# Patient Record
Sex: Female | Born: 1990 | Hispanic: Yes | Marital: Single | Smoking: Former smoker
Health system: Southern US, Community
[De-identification: ages and names within clinical notes are randomized; demographics above are authoritative.]

## PROBLEM LIST (undated history)

## (undated) ENCOUNTER — Emergency Department (HOSPITAL_COMMUNITY): Payer: Self-pay | Source: Home / Self Care

## (undated) DIAGNOSIS — K859 Acute pancreatitis without necrosis or infection, unspecified: Secondary | ICD-10-CM

## (undated) DIAGNOSIS — E079 Disorder of thyroid, unspecified: Secondary | ICD-10-CM

## (undated) DIAGNOSIS — E119 Type 2 diabetes mellitus without complications: Secondary | ICD-10-CM

## (undated) DIAGNOSIS — U071 COVID-19: Secondary | ICD-10-CM

## (undated) HISTORY — PX: THYROIDECTOMY: SHX17

## (undated) HISTORY — PX: HERNIA REPAIR: SHX51

---

## 2017-05-16 ENCOUNTER — Encounter (HOSPITAL_COMMUNITY): Payer: Self-pay | Admitting: Emergency Medicine

## 2017-05-16 DIAGNOSIS — R1010 Upper abdominal pain, unspecified: Secondary | ICD-10-CM | POA: Insufficient documentation

## 2017-05-16 DIAGNOSIS — Z3A01 Less than 8 weeks gestation of pregnancy: Secondary | ICD-10-CM | POA: Diagnosis not present

## 2017-05-16 DIAGNOSIS — O9989 Other specified diseases and conditions complicating pregnancy, childbirth and the puerperium: Secondary | ICD-10-CM | POA: Insufficient documentation

## 2017-05-16 DIAGNOSIS — Z87891 Personal history of nicotine dependence: Secondary | ICD-10-CM | POA: Diagnosis not present

## 2017-05-16 LAB — COMPREHENSIVE METABOLIC PANEL
ALT: 15 U/L (ref 14–54)
ANION GAP: 8 (ref 5–15)
AST: 16 U/L (ref 15–41)
Albumin: 4.2 g/dL (ref 3.5–5.0)
Alkaline Phosphatase: 53 U/L (ref 38–126)
BUN: 14 mg/dL (ref 6–20)
CALCIUM: 9.5 mg/dL (ref 8.9–10.3)
CHLORIDE: 107 mmol/L (ref 101–111)
CO2: 24 mmol/L (ref 22–32)
Creatinine, Ser: 0.75 mg/dL (ref 0.44–1.00)
Glucose, Bld: 115 mg/dL — ABNORMAL HIGH (ref 65–99)
Potassium: 3.6 mmol/L (ref 3.5–5.1)
SODIUM: 139 mmol/L (ref 135–145)
Total Bilirubin: 0.5 mg/dL (ref 0.3–1.2)
Total Protein: 7.2 g/dL (ref 6.5–8.1)

## 2017-05-16 LAB — I-STAT BETA HCG BLOOD, ED (MC, WL, AP ONLY): HCG, QUANTITATIVE: 6.9 m[IU]/mL — AB (ref <5)

## 2017-05-16 LAB — CBC
HCT: 38 % (ref 36.0–46.0)
HEMOGLOBIN: 12.4 g/dL (ref 12.0–15.0)
MCH: 25.1 pg — AB (ref 26.0–34.0)
MCHC: 32.6 g/dL (ref 30.0–36.0)
MCV: 76.9 fL — AB (ref 78.0–100.0)
Platelets: 190 10*3/uL (ref 150–400)
RBC: 4.94 MIL/uL (ref 3.87–5.11)
RDW: 14.6 % (ref 11.5–15.5)
WBC: 11 10*3/uL — AB (ref 4.0–10.5)

## 2017-05-16 LAB — LIPASE, BLOOD: LIPASE: 27 U/L (ref 11–51)

## 2017-05-16 NOTE — ED Notes (Signed)
Pt up to desk waiting for update on times and delays.  Updated and apologized for wait

## 2017-05-16 NOTE — ED Triage Notes (Signed)
Pt presents to ED for assessment of upper abdominal pain intermittent since her last hospitalization for pancreatitis.  Pt states worse x 1 week, with abdominal bloating, diarrhea x 3 days, and intermittent nausea.

## 2017-05-17 ENCOUNTER — Emergency Department (HOSPITAL_COMMUNITY)
Admission: EM | Admit: 2017-05-17 | Discharge: 2017-05-17 | Disposition: A | Discharge status: Home / Self Care | Payer: Medicaid Other | Attending: Emergency Medicine | Admitting: Emergency Medicine

## 2017-05-17 DIAGNOSIS — R1012 Left upper quadrant pain: Secondary | ICD-10-CM

## 2017-05-17 DIAGNOSIS — Z3A01 Less than 8 weeks gestation of pregnancy: Secondary | ICD-10-CM

## 2017-05-17 HISTORY — DX: Disorder of thyroid, unspecified: E07.9

## 2017-05-17 HISTORY — DX: Acute pancreatitis without necrosis or infection, unspecified: K85.90

## 2017-05-17 LAB — HCG, SERUM, QUALITATIVE: PREG SERUM: POSITIVE — AB

## 2017-05-17 LAB — HCG, QUANTITATIVE, PREGNANCY: HCG, BETA CHAIN, QUANT, S: 14 m[IU]/mL — AB (ref <5)

## 2017-05-17 MED ORDER — RANITIDINE HCL 150 MG PO TABS: 150.0000 mg | ORAL_TABLET | Freq: Two times a day (BID) | ORAL | 0 refills | Status: DC

## 2017-05-17 NOTE — ED Provider Notes (Signed)
[facility]-EMERGENCY DEPT Provider Note   CSN: [id number] Arrival date & time: 05/16/17  2034   By signing my name below, I, Margaret Walsh, attest that this documentation has been prepared under the direction and in the presence of Pollina, Canary Brim, * . Electronically Signed: Freida Walsh, Scribe. 05/17/2017. 1:35 AM.   History   Chief Complaint Chief Complaint  Patient presents with  . Abdominal Pain     The history is provided by the patient and a relative. No language interpreter was used.     HPI Comments:  Margaret Walsh is a 26 y.o. female who presents to the Emergency Department complaining of moderate LUQ abdominal pain x 1 week. Pt notes associated nausea and diarrhea. She reports h/o same pain; states she was diagnosed with pancreatitis while she was still living in [place] She states she has pain with or without eating. She denies ETOH use. No alleviating factors noted. Pt has an appointment with new PCP on 06/17/2017. Pt is not a native [place] speaker; history translated by family at bedside.    Past Medical History:  Diagnosis Date  . Pancreatitis   . Thyroid disease     There are no active problems to display for this patient.   Past Surgical History:  Procedure Laterality Date  . THYROIDECTOMY      OB History    No data available       Home Medications    Prior to Admission medications   Medication Sig Start Date End Date Taking? Authorizing Provider  levothyroxine (SYNTHROID, LEVOTHROID) 125 MCG tablet Take 125 mcg by mouth daily before breakfast.   Yes [provider]  ranitidine (ZANTAC) 150 MG tablet Take 1 tablet (150 mg total) by mouth 2 (two) times daily. 05/17/17   Gilda Crease, MD    Family History History reviewed. No pertinent family history.  Social History Social History  Substance Use Topics  . Smoking status: Former Games developer  . Smokeless tobacco: Never Used  . Alcohol use No     Allergies   Patient has no  known allergies.   Review of Systems Review of Systems  Constitutional: Negative for fever.  Gastrointestinal: Positive for abdominal pain, diarrhea and nausea.  All other systems reviewed and are negative.    Physical Exam Updated Vital Signs BP (!) 113/98   Pulse 91   Temp 98.4 F (36.9 C) (Oral)   Resp 18   SpO2 96%   Physical Exam  Constitutional: She is oriented to person, place, and time. She appears well-developed and well-nourished. No distress.  HENT:  Head: Normocephalic and atraumatic.  Right Ear: Hearing normal.  Left Ear: Hearing normal.  Nose: Nose normal.  Mouth/Throat: Oropharynx is clear and moist and mucous membranes are normal.  Eyes: Conjunctivae and EOM are normal. Pupils are equal, round, and reactive to light.  Neck: Normal range of motion. Neck supple.  Cardiovascular: Regular rhythm, S1 normal and S2 normal.  Exam reveals no gallop and no friction rub.   No murmur heard. Pulmonary/Chest: Effort normal and breath sounds normal. No respiratory distress. She exhibits no tenderness.  Abdominal: Soft. Normal appearance and bowel sounds are normal. There is no hepatosplenomegaly. There is tenderness in the left upper quadrant. There is no rebound, no guarding, no tenderness at McBurney's point and negative Murphy's sign. No hernia.  Musculoskeletal: Normal range of motion.  Neurological: She is alert and oriented to person, place, and time. She has normal strength. No cranial nerve deficit  or sensory deficit. Coordination normal. GCS eye subscore is 4. GCS verbal subscore is 5. GCS motor subscore is 6.  Skin: Skin is warm, dry and intact. No rash noted. No cyanosis.  Psychiatric: She has a normal mood and affect. Her speech is normal and behavior is normal. Thought content normal.  Nursing note and vitals reviewed.    ED Treatments / Results  DIAGNOSTIC STUDIES:  Oxygen Saturation is 100% on RA, normal by my interpretation.    COORDINATION OF  CARE:  1:30 AM Discussed treatment plan with pt at bedside and pt agreed to plan.  Labs (all labs ordered are listed, but only abnormal results are displayed) Labs Reviewed  COMPREHENSIVE METABOLIC PANEL - Abnormal; Notable for the following:       Result Value   Glucose, Bld 115 (*)    All other components within normal limits  CBC - Abnormal; Notable for the following:    WBC 11.0 (*)    MCV 76.9 (*)    MCH 25.1 (*)    All other components within normal limits  HCG, SERUM, QUALITATIVE - Abnormal; Notable for the following:    Preg, Serum POSITIVE (*)    All other components within normal limits  HCG, QUANTITATIVE, PREGNANCY - Abnormal; Notable for the following:    hCG, Beta Chain, Quant, S 14 (*)    All other components within normal limits  I-STAT BETA HCG BLOOD, ED ([facility], [facility], [facility] ONLY) - Abnormal; Notable for the following:    I-stat hCG, quantitative 6.9 (*)    All other components within normal limits  LIPASE, BLOOD    EKG  EKG Interpretation None       Radiology No results found.  Procedures Procedures (including critical care time)  Medications Ordered in ED Medications - No data to display   Initial Impression / Assessment and Plan / ED Course  I have reviewed the triage vital signs and the nursing notes.  Pertinent labs & imaging results that were available during my care of the patient were reviewed by me and considered in my medical decision making (see chart for details).     Patient presents to the emergency part for evaluation of abdominal pain. Patient reports that she had a long period of upper abdominal pain associated with nausea, vomiting and diarrhea when she was living in [place] She ultimately ended up hospitalized and told that she had pancreatitis. Cause of pancreatitis was unknown, she did not have a drinking history. No known gallbladder disease. Patient reports that she has started to have symptoms once again. She reports pain in the  upper abdomen, predominantly on the left side. She also has a history of acid reflux disease, no known peptic ulcer disease. No history of hematemesis or melanotic stools.  Patient's blood work unremarkable. She did, however, have a borderline elevated i-STAT beta hCG at 6.9. A formal quantitative beta hCG was performed by the lab and this returned at 14. This is felt to be somewhat equivocal, but patient is actively trying to become pregnant. Have to consider very early pregnancy, patient will be treated with Zantac and follow-up with OB/GYN. There is no lower abdominal pain, pelvic pain or concern for obstetric complication, ectopic pregnancy, etc. She has not had any vaginal bleeding.  Final Clinical Impressions(s) / ED Diagnoses   Final diagnoses:  Left upper quadrant pain  Less than [redacted] weeks gestation of pregnancy    New Prescriptions New Prescriptions   RANITIDINE (ZANTAC) 150 MG  TABLET    Take 1 tablet (150 mg total) by mouth 2 (two) times daily.   I personally performed the services described in this documentation, which was scribed in my presence. The recorded information has been reviewed and is accurate.     Gilda CreasePollina, Christopher J, MD 05/17/17 [phone]

## 2018-04-18 ENCOUNTER — Encounter (HOSPITAL_COMMUNITY): Payer: Self-pay | Admitting: Emergency Medicine

## 2018-04-18 ENCOUNTER — Ambulatory Visit (HOSPITAL_COMMUNITY)
Admission: EM | Admit: 2018-04-18 | Discharge: 2018-04-18 | Disposition: A | Discharge status: Home / Self Care | Payer: Medicaid Other

## 2018-04-18 DIAGNOSIS — K429 Umbilical hernia without obstruction or gangrene: Secondary | ICD-10-CM

## 2018-04-18 NOTE — ED Triage Notes (Signed)
PT reports she has gotten diarrhea the last two times she has gone to a [place] buffet. PT went again Saturday and experienced diarrhea and abdominal pain. PT reports epigastric pain and umbilical pain. PT has known umbilical hernia. Last episode of diarrhea was yesterday. Gave birth 2.5 months ago.   PT is not breastfeeding

## 2018-04-18 NOTE — ED Provider Notes (Signed)
04/18/2018 2:15 PM   DOB: 01-Jul-1991 / MRN: [id number]  SUBJECTIVE:  Margaret Walsh is a 27 y.o. female presenting for diarrhea that occurs with eating [place] food.  Patient had 2 episodes of nonbloody diarrhea stools after eating [place] food.  She tells me the diarrhea has resolved at this time.  She is concerned about a hernia near the umbilicus.  This is been present for months now.  Tells me there is occasional pain about the area.  She denies fever, chills, nausea, rash at this time.  She is moving her bowels normally.  She is urinating normally.  She is allergic to thyroid hormones.   She  has a past medical history of Pancreatitis and Thyroid disease.    She  reports that she has quit smoking. She has never used smokeless tobacco. She reports that she has current or past drug history. Drug: Marijuana. She reports that she does not drink alcohol. She  has no sexual activity history on file. The patient  has a past surgical history that includes Thyroidectomy.  Her family history is not on file.  ROS per HPI  OBJECTIVE:  BP 115/66   Pulse 91   Temp 98.3 F (36.8 C) (Oral)   Resp 16   Wt 150 lb (68 kg)   SpO2 97%   Wt Readings from Last 3 Encounters:  04/18/18 150 lb (68 kg)   Temp Readings from Last 3 Encounters:  04/18/18 98.3 F (36.8 C) (Oral)  05/16/17 98.4 F (36.9 C) (Oral)   BP Readings from Last 3 Encounters:  04/18/18 115/66  05/17/17 (!) 113/98   Pulse Readings from Last 3 Encounters:  04/18/18 91  05/17/17 91    Physical Exam  Constitutional: She is oriented to person, place, and time. She appears well-nourished. No distress.  Eyes: Pupils are equal, round, and reactive to light. EOM are normal.  Cardiovascular: Normal rate.  Pulmonary/Chest: Effort normal.  Abdominal: Bowel sounds are normal. She exhibits no distension and no pulsatile midline mass. There is no hepatosplenomegaly, splenomegaly or hepatomegaly. There is no tenderness. There is no  rigidity, no rebound, no guarding, no CVA tenderness, no tenderness at McBurney's point and negative Murphy's sign. A hernia (Very small umbilical hernia easily reducible.  Negative for tenderness.) is present. Hernia confirmed negative in the right inguinal area and confirmed negative in the left inguinal area.  Neurological: She is alert and oriented to person, place, and time. No cranial nerve deficit. Gait normal.  Skin: Skin is dry. She is not diaphoretic.  Psychiatric: She has a normal mood and affect.  Vitals reviewed.   No results found for this or any previous visit (from the past 72 hour(s)).  No results found.  ASSESSMENT AND PLAN:   Umbilical hernia without obstruction and without gangrene: Exam is reassuring.  [facility]    Discharge Instructions     I have given you the number to a surgeon say that they may give you a second opinion.  Your hernia is not a threat to your health at this time.        The patient is advised to call or return to clinic if she does not see an improvement in symptoms, or to seek the care of the closest emergency department if she worsens with the above plan.   Deliah BostonMichael Shimon Trowbridge, MHS, PA-C 04/18/2018 2:15 PM   Ofilia Neaslark, Edinson Domeier L, PA-C 04/18/18 1416

## 2018-04-18 NOTE — Discharge Instructions (Signed)
I have given you the number to a surgeon say that they may give you a second opinion.  Your hernia is not a threat to your health at this time.

## 2018-05-14 ENCOUNTER — Other Ambulatory Visit: Payer: Self-pay

## 2018-05-14 ENCOUNTER — Encounter (HOSPITAL_COMMUNITY): Payer: Self-pay | Admitting: Emergency Medicine

## 2018-05-14 ENCOUNTER — Emergency Department (HOSPITAL_COMMUNITY)
Admission: EM | Admit: 2018-05-14 | Discharge: 2018-05-15 | Disposition: A | Discharge status: Home / Self Care | Payer: Medicaid Other | Attending: Emergency Medicine | Admitting: Emergency Medicine

## 2018-05-14 DIAGNOSIS — Z87891 Personal history of nicotine dependence: Secondary | ICD-10-CM | POA: Diagnosis not present

## 2018-05-14 DIAGNOSIS — R110 Nausea: Secondary | ICD-10-CM | POA: Insufficient documentation

## 2018-05-14 DIAGNOSIS — R197 Diarrhea, unspecified: Secondary | ICD-10-CM | POA: Insufficient documentation

## 2018-05-14 DIAGNOSIS — E039 Hypothyroidism, unspecified: Secondary | ICD-10-CM | POA: Diagnosis not present

## 2018-05-14 DIAGNOSIS — R1012 Left upper quadrant pain: Secondary | ICD-10-CM | POA: Diagnosis present

## 2018-05-14 DIAGNOSIS — K429 Umbilical hernia without obstruction or gangrene: Secondary | ICD-10-CM | POA: Diagnosis not present

## 2018-05-14 DIAGNOSIS — Z79899 Other long term (current) drug therapy: Secondary | ICD-10-CM | POA: Diagnosis not present

## 2018-05-14 LAB — COMPREHENSIVE METABOLIC PANEL
ALK PHOS: 54 U/L (ref 38–126)
ALT: 18 U/L (ref 0–44)
AST: 14 U/L — AB (ref 15–41)
Albumin: 3.9 g/dL (ref 3.5–5.0)
Anion gap: 9 (ref 5–15)
BUN: 20 mg/dL (ref 6–20)
CALCIUM: 9.3 mg/dL (ref 8.9–10.3)
CHLORIDE: 106 mmol/L (ref 98–111)
CO2: 25 mmol/L (ref 22–32)
CREATININE: 1.16 mg/dL — AB (ref 0.44–1.00)
GFR calc Af Amer: >60 mL/min (ref >60)
Glucose, Bld: 92 mg/dL (ref 70–99)
Potassium: 4.2 mmol/L (ref 3.5–5.1)
SODIUM: 140 mmol/L (ref 135–145)
Total Bilirubin: 0.7 mg/dL (ref 0.3–1.2)
Total Protein: 7.3 g/dL (ref 6.5–8.1)

## 2018-05-14 LAB — LIPASE, BLOOD: Lipase: 36 U/L (ref 11–51)

## 2018-05-14 LAB — URINALYSIS, ROUTINE W REFLEX MICROSCOPIC
Bilirubin Urine: NEGATIVE
Glucose, UA: NEGATIVE mg/dL
Hgb urine dipstick: NEGATIVE
Ketones, ur: NEGATIVE mg/dL
Nitrite: NEGATIVE
PH: 7 (ref 5.0–8.0)
Protein, ur: NEGATIVE mg/dL
SPECIFIC GRAVITY, URINE: 1.02 (ref 1.005–1.030)

## 2018-05-14 LAB — CBC
HCT: 39.4 % (ref 36.0–46.0)
Hemoglobin: 12.2 g/dL (ref 12.0–15.0)
MCH: 25.4 pg — AB (ref 26.0–34.0)
MCHC: 31 g/dL (ref 30.0–36.0)
MCV: 81.9 fL (ref 78.0–100.0)
PLATELETS: 198 10*3/uL (ref 150–400)
RBC: 4.81 MIL/uL (ref 3.87–5.11)
RDW: 14.1 % (ref 11.5–15.5)
WBC: 9.2 10*3/uL (ref 4.0–10.5)

## 2018-05-14 LAB — I-STAT BETA HCG BLOOD, ED (MC, WL, AP ONLY): I-stat hCG, quantitative: <5 m[IU]/mL (ref <5)

## 2018-05-14 NOTE — ED Triage Notes (Signed)
Pt c/o LUQ pain radiating around mid abdomen onset yesterday, +n/v/d, pt has known hernia and is 3 mo PP.

## 2018-05-15 ENCOUNTER — Encounter (HOSPITAL_COMMUNITY): Payer: Self-pay | Admitting: Radiology

## 2018-05-15 ENCOUNTER — Emergency Department (HOSPITAL_COMMUNITY): Payer: Medicaid Other

## 2018-05-15 LAB — I-STAT CG4 LACTIC ACID, ED: Lactic Acid, Venous: 0.65 mmol/L (ref 0.5–1.9)

## 2018-05-15 MED ORDER — NAPROXEN 500 MG PO TABS: 500.0000 mg | ORAL_TABLET | Freq: Two times a day (BID) | ORAL | 0 refills | Status: DC

## 2018-05-15 MED ORDER — KETOROLAC TROMETHAMINE 30 MG/ML IJ SOLN
30.0000 mg | Freq: Once | INTRAMUSCULAR | Status: AC
  Administered 2018-05-15: 30 mg via INTRAVENOUS
  Filled 2018-05-15: qty 1

## 2018-05-15 MED ORDER — HYDROMORPHONE HCL 1 MG/ML IJ SOLN
1.0000 mg | Freq: Once | INTRAMUSCULAR | Status: AC
  Administered 2018-05-15: 1 mg via INTRAVENOUS
  Filled 2018-05-15: qty 1

## 2018-05-15 MED ORDER — ONDANSETRON HCL 4 MG/2ML IJ SOLN
4.0000 mg | Freq: Once | INTRAMUSCULAR | Status: AC
  Administered 2018-05-15: 4 mg via INTRAVENOUS
  Filled 2018-05-15: qty 2

## 2018-05-15 MED ORDER — ONDANSETRON 4 MG PO TBDP: 4.0000 mg | ORAL_TABLET | Freq: Three times a day (TID) | ORAL | 0 refills | Status: DC | PRN

## 2018-05-15 MED ORDER — IOHEXOL 300 MG/ML  SOLN
100.0000 mL | Freq: Once | INTRAMUSCULAR | Status: AC
  Administered 2018-05-15: 100 mL via INTRAVENOUS

## 2018-05-15 MED ORDER — OXYCODONE-ACETAMINOPHEN 5-325 MG PO TABS: 1.0000 | ORAL_TABLET | Freq: Four times a day (QID) | ORAL | 0 refills | Status: DC | PRN

## 2018-05-15 MED ORDER — SODIUM CHLORIDE 0.9 % IV BOLUS
500.0000 mL | Freq: Once | INTRAVENOUS | Status: AC
  Administered 2018-05-15: 500 mL via INTRAVENOUS

## 2018-05-15 NOTE — ED Provider Notes (Signed)
[facility] EMERGENCY DEPARTMENT Provider Note   CSN: [id number] Arrival date & time: 05/14/18  2214     History   Chief Complaint Chief Complaint  Patient presents with  . Abdominal Pain    HPI Margaret Walsh is a 27 y.o. female.  27 year old female with a history of pancreatitis and thyroid nodule presents to the emergency department for abdominal pain.  Pain is in her left upper quadrant and is nonradiating.  She has had associated nausea and reports a few episodes of diarrhea yesterday.  When asked to point to the location of pain, patient indicates pain primarily at her umbilicus.  This is the site of a known umbilical hernia.  She is 3 months postpartum and states that she followed up with general surgery.  They had decided not to surgically repair her hernia as there is plans for her to have future children.  She has no history of abdominal surgeries.  No fevers, vomiting, melena, hematochezia, dysuria, hematuria, vaginal bleeding or discharge.  Took ibuprofen for discomfort prior to arrival without relief.  She is not breastfeeding.  The history is provided by the patient. No language interpreter was used.  Abdominal Pain      Past Medical History:  Diagnosis Date  . Pancreatitis   . Thyroid disease     There are no active problems to display for this patient.   Past Surgical History:  Procedure Laterality Date  . THYROIDECTOMY       OB History   None      Home Medications    Prior to Admission medications   Medication Sig Start Date End Date Taking? Authorizing Provider  levothyroxine (SYNTHROID, LEVOTHROID) 125 MCG tablet Take 125 mcg by mouth daily before breakfast.    [provider]  naproxen (NAPROSYN) 500 MG tablet Take 1 tablet (500 mg total) by mouth 2 (two) times daily. 05/15/18   Antony Madura, PA-C  oxyCODONE-acetaminophen (PERCOCET/ROXICET) 5-325 MG tablet Take 1-2 tablets by mouth every 6 (six) hours as needed for severe  pain. 05/15/18   Antony Madura, PA-C  ranitidine (ZANTAC) 150 MG tablet Take 1 tablet (150 mg total) by mouth 2 (two) times daily. 05/17/17   Gilda Crease, MD    Family History No family history on file.  Social History Social History   Tobacco Use  . Smoking status: Former Games developer  . Smokeless tobacco: Never Used  Substance Use Topics  . Alcohol use: No  . Drug use: Yes    Types: Marijuana     Allergies   Thyroid hormones   Review of Systems Review of Systems  Gastrointestinal: Positive for abdominal pain.  Ten systems reviewed and are negative for acute change, except as noted in the HPI.    Physical Exam Updated Vital Signs BP 111/81   Pulse 81   Temp (!) 97.5 F (36.4 C) (Oral)   Resp 16   Ht 5\' 3"  (1.6 m)   Wt 79.8 kg (176 lb)   LMP 04/27/2018   SpO2 100%   BMI 31.18 kg/m   Physical Exam  Constitutional: She is oriented to person, place, and time. She appears well-developed and well-nourished. No distress.  Appears uncomfortable.  HENT:  Head: Normocephalic and atraumatic.  Eyes: Conjunctivae and EOM are normal. No scleral icterus.  Neck: Normal range of motion.  Cardiovascular: Normal rate, regular rhythm and intact distal pulses.  Pulmonary/Chest: Effort normal. No stridor. No respiratory distress. She has no wheezes.  Respirations even  and unlabored  Abdominal:  Palpable umbilical hernia.  Tender to palpation.  Unable to reduce secondary to discomfort.  Abdomen is otherwise soft.  Palpation in lower quadrants refers to the left upper quadrant.  No peritoneal signs.  Musculoskeletal: Normal range of motion.  Neurological: She is alert and oriented to person, place, and time. She exhibits normal muscle tone. Coordination normal.  Skin: Skin is warm and dry. No rash noted. She is not diaphoretic. No erythema. No pallor.  Psychiatric: She has a normal mood and affect. Her behavior is normal.  Nursing note and vitals reviewed.    ED Treatments /  Results  Labs (all labs ordered are listed, but only abnormal results are displayed) Labs Reviewed  COMPREHENSIVE METABOLIC PANEL - Abnormal; Notable for the following components:      Result Value   Creatinine, Ser 1.16 (*)    AST 14 (*)    All other components within normal limits  CBC - Abnormal; Notable for the following components:   MCH 25.4 (*)    All other components within normal limits  URINALYSIS, ROUTINE W REFLEX MICROSCOPIC - Abnormal; Notable for the following components:   APPearance HAZY (*)    Leukocytes, UA MODERATE (*)    Bacteria, UA RARE (*)    All other components within normal limits  LIPASE, BLOOD  I-STAT BETA HCG BLOOD, ED ([facility], [facility], [facility] ONLY)  I-STAT CG4 LACTIC ACID, ED    EKG None  Radiology Ct Abdomen Pelvis W Contrast  Result Date: 05/15/2018 CLINICAL DATA:  Abdominal pain around a hernia. EXAM: CT ABDOMEN AND PELVIS WITH CONTRAST TECHNIQUE: Multidetector CT imaging of the abdomen and pelvis was performed using the standard protocol following bolus administration of intravenous contrast. CONTRAST:  100mL OMNIPAQUE IOHEXOL 300 MG/ML  SOLN COMPARISON:  None. FINDINGS: Lower chest:  No contributory findings. Hepatobiliary: Tiny low-density in the right liver that is too small for densitometry/characterization.No evidence of biliary obstruction or stone. Pancreas: Unremarkable. Spleen: Unremarkable. Adrenals/Urinary Tract: Negative adrenals. No hydronephrosis or stone. Unremarkable bladder. Stomach/Bowel:  No obstruction. No appendicitis. Vascular/Lymphatic: No acute vascular abnormality. No mass or adenopathy. Reproductive:No pathologic findings. Other: Small fatty periumbilical hernia. Mild reticulation of herniated fat. More prominent reticulation of neighboring subcutaneous fat. No collection. Musculoskeletal: No acute abnormalities. IMPRESSION: Small fatty umbilical hernia. The herniated fat is mildly reticulated but there is prominent edema of the neighboring  subcutaneous fat. This could reflect cellulitis or reaction to hernia incarceration. Electronically Signed   By: Marnee SpringJonathon  Watts M.D.   On: 05/15/2018 07:15    Procedures Procedures (including critical care time)  Medications Ordered in ED Medications  ketorolac (TORADOL) 30 MG/ML injection 30 mg (has no administration in time range)  ondansetron (ZOFRAN) injection 4 mg (has no administration in time range)  HYDROmorphone (DILAUDID) injection 1 mg (1 mg Intravenous Given 05/15/18 0603)  sodium chloride 0.9 % bolus 500 mL (500 mLs Intravenous New Bag/Given 05/15/18 0602)  iohexol (OMNIPAQUE) 300 MG/ML solution 100 mL (100 mLs Intravenous Contrast Given 05/15/18 0639)     Initial Impression / Assessment and Plan / ED Course  I have reviewed the triage vital signs and the nursing notes.  Pertinent labs & imaging results that were available during my care of the patient were reviewed by me and considered in my medical decision making (see chart for details).     27 year old female presents for abdominal pain.  She has a palpable umbilical hernia.  This was unable to be reduced following pain  medication.  A CT scan was performed which shows herniation of abdominal fat.  Question early strangulation of the fat.  No other fluid collection.  No bowel involvement.  Laboratory work-up today has been reassuring.  Pain controlled with Dilaudid and Toradol.  Have advised follow-up with general surgery.  Will give NSAIDs and pain medication for home use.  Despite being postpartum, patient is not breast-feeding.  She has been instructed to return for new or concerning symptoms.   Final Clinical Impressions(s) / ED Diagnoses   Final diagnoses:  Umbilical hernia without obstruction and without gangrene    ED Discharge Orders        Ordered    naproxen (NAPROSYN) 500 MG tablet  2 times daily     05/15/18 0737    oxyCODONE-acetaminophen (PERCOCET/ROXICET) 5-325 MG tablet  Every 6 hours PRN     05/15/18  0737       Antony Madura, PA-C 05/15/18 0739    Gilda Crease, MD 05/15/18 [phone]

## 2018-05-15 NOTE — ED Notes (Signed)
Patient transported to CT 

## 2018-05-15 NOTE — ED Notes (Signed)
ED Provider at bedside. 

## 2018-05-15 NOTE — Discharge Instructions (Signed)
Recomendamos realizar un seguimiento con [place] general para una evaluacin adicional de los sntomas.  Tome naproxeno segn lo prescrito en el intermedio para el control del dolor.  Si el dolor sigue siendo intenso, puede usar Percocet segn lo prescrito.  No conduzca ni beba alcohol despus de tomar [employer] Corporationeste medicamento, ya que puede causarle somnolencia y Audiological scientistafectar su juicio.  Usted puede regresar al departamento de emergencias para sntomas nuevos o preocupantes.  We recommend follow-up with general surgery for further evaluation of your symptoms.  Take naproxen as prescribed in the interim for pain control.  Should your pain remains severe, you may use Percocet as prescribed.  Do not drive or drink alcohol after taking this medication as it may make you drowsy and impair your judgment.  You may return to the emergency department for new or concerning symptoms.

## 2018-06-26 ENCOUNTER — Encounter (HOSPITAL_COMMUNITY): Payer: Self-pay | Admitting: Emergency Medicine

## 2018-06-26 ENCOUNTER — Other Ambulatory Visit: Payer: Self-pay

## 2018-06-26 ENCOUNTER — Emergency Department (HOSPITAL_COMMUNITY)
Admission: EM | Admit: 2018-06-26 | Discharge: 2018-06-27 | Disposition: A | Discharge status: Home / Self Care | Payer: Medicaid Other | Attending: Emergency Medicine | Admitting: Emergency Medicine

## 2018-06-26 ENCOUNTER — Emergency Department (HOSPITAL_COMMUNITY): Payer: Medicaid Other

## 2018-06-26 DIAGNOSIS — Z79899 Other long term (current) drug therapy: Secondary | ICD-10-CM | POA: Diagnosis not present

## 2018-06-26 DIAGNOSIS — Z87891 Personal history of nicotine dependence: Secondary | ICD-10-CM | POA: Insufficient documentation

## 2018-06-26 DIAGNOSIS — Z23 Encounter for immunization: Secondary | ICD-10-CM | POA: Insufficient documentation

## 2018-06-26 DIAGNOSIS — S61011A Laceration without foreign body of right thumb without damage to nail, initial encounter: Secondary | ICD-10-CM | POA: Diagnosis present

## 2018-06-26 DIAGNOSIS — Y92000 Kitchen of unspecified non-institutional (private) residence as  the place of occurrence of the external cause: Secondary | ICD-10-CM | POA: Insufficient documentation

## 2018-06-26 DIAGNOSIS — Y999 Unspecified external cause status: Secondary | ICD-10-CM | POA: Insufficient documentation

## 2018-06-26 DIAGNOSIS — W260XXA Contact with knife, initial encounter: Secondary | ICD-10-CM | POA: Insufficient documentation

## 2018-06-26 DIAGNOSIS — Y93G1 Activity, food preparation and clean up: Secondary | ICD-10-CM | POA: Insufficient documentation

## 2018-06-26 MED ORDER — ONDANSETRON 4 MG PO TBDP
4.0000 mg | ORAL_TABLET | Freq: Once | ORAL | Status: AC
  Administered 2018-06-26: 4 mg via ORAL
  Filled 2018-06-26: qty 1

## 2018-06-26 MED ORDER — LIDOCAINE HCL (PF) 1 % IJ SOLN
5.0000 mL | Freq: Once | INTRAMUSCULAR | Status: AC
  Administered 2018-06-26: 5 mL
  Filled 2018-06-26: qty 5

## 2018-06-26 MED ORDER — HYDROCODONE-ACETAMINOPHEN 5-325 MG PO TABS
1.0000 | ORAL_TABLET | Freq: Once | ORAL | Status: AC
  Administered 2018-06-26: 1 via ORAL
  Filled 2018-06-26: qty 1

## 2018-06-26 NOTE — ED Notes (Signed)
Pt walked to bathroom 

## 2018-06-26 NOTE — ED Provider Notes (Signed)
[facility] Provider Note   CSN: [id number] Arrival date & time: 06/26/18  1949     History   Chief Complaint Chief Complaint  Patient presents with  . Laceration    HPI    [employer] translation services used during this visit. Margaret Walsh is a 27 y.o. female presenting for laceration to the pad of her right thumb that occurred this afternoon while peeling potatoes.  Bleeding controlled with direct pressure.  Patient describes pain as sharp, 8/10 in severity.  Patient states that she has not taken anything for pain prior to arrival.  Patient states that pain is worse with movement of the thumb and palpation.  HPI  Past Medical History:  Diagnosis Date  . Pancreatitis   . Thyroid disease     There are no active problems to display for this patient.   Past Surgical History:  Procedure Laterality Date  . THYROIDECTOMY       OB History   None      Home Medications    Prior to Admission medications   Medication Sig Start Date End Date Taking? Authorizing Provider  HYDROcodone-acetaminophen (NORCO/VICODIN) 5-325 MG tablet Take 1 tablet by mouth every 6 (six) hours as needed. 06/27/18   Harlene SaltsMorelli, Yash Cacciola A, PA-C  levothyroxine (SYNTHROID, LEVOTHROID) 125 MCG tablet Take 125 mcg by mouth daily before breakfast.    [provider]  naproxen (NAPROSYN) 500 MG tablet Take 1 tablet (500 mg total) by mouth 2 (two) times daily. 05/15/18   Antony MaduraHumes, Kelly, PA-C  ondansetron (ZOFRAN ODT) 4 MG disintegrating tablet Take 1 tablet (4 mg total) by mouth every 8 (eight) hours as needed for nausea or vomiting. 05/15/18   Antony MaduraHumes, Kelly, PA-C  oxyCODONE-acetaminophen (PERCOCET/ROXICET) 5-325 MG tablet Take 1-2 tablets by mouth every 6 (six) hours as needed for severe pain. 05/15/18   Antony MaduraHumes, Kelly, PA-C  ranitidine (ZANTAC) 150 MG tablet Take 1 tablet (150 mg total) by mouth 2 (two) times daily. 05/17/17   Gilda CreasePollina, Christopher J, MD    Family History No  family history on file.  Social History Social History   Tobacco Use  . Smoking status: Former Games developermoker  . Smokeless tobacco: Never Used  Substance Use Topics  . Alcohol use: No  . Drug use: Yes    Types: Marijuana     Allergies   Thyroid hormones   Review of Systems Review of Systems  Constitutional: Negative.  Negative for chills, fatigue and fever.  Skin: Positive for wound.  Neurological: Negative.  Negative for dizziness, syncope, weakness, light-headedness and numbness.     Physical Exam Updated Vital Signs BP 113/70 (BP Location: Left Arm)   Pulse 76   Temp 98.4 F (36.9 C) (Oral)   Resp 15   Ht 5\' 3"  (1.6 m)   Wt 76.2 kg   LMP 06/19/2018   SpO2 99%   BMI 29.76 kg/m   Physical Exam  Constitutional: She is oriented to person, place, and time. She appears well-developed and well-nourished. No distress.  HENT:  Head: Normocephalic and atraumatic.  Right Ear: External ear normal.  Left Ear: External ear normal.  Nose: Nose normal.  Eyes: Pupils are equal, round, and reactive to light. EOM are normal.  Neck: Trachea normal and normal range of motion. No tracheal deviation present.  Pulmonary/Chest: Effort normal. No respiratory distress.  Musculoskeletal: Normal range of motion.       Right hand: She exhibits normal range of motion, normal two-point discrimination  and normal capillary refill. Normal sensation noted. Normal strength noted.       Left hand: Normal.  Right hand:  Tenderness to palpation over right thumb pad.   No snuffbox tenderness to palpation. No tenderness to palpation over flexor sheath.  Finger adduction/abduction intact with 5/5 strength.  Thumb opposition intact. Full active and resisted ROM to flexion/extension at wrist, MCP, PIP and DIP of all fingers.  FDS/FDP intact. Grip 5/5 strength.  Radial artery 2+ with <2sec cap refill in all fingers.  Sensation intact to light-tough in median/ulnar/radial distributions.  Neurological: She is  alert and oriented to person, place, and time. No sensory deficit.  Skin: Skin is warm and dry. Capillary refill takes less than 2 seconds.     Psychiatric: She has a normal mood and affect. Her behavior is normal.        Thumb tourniquet on for less than 10 minutes. ED Treatments / Results  Labs (all labs ordered are listed, but only abnormal results are displayed) Labs Reviewed - No data to display  EKG None  Radiology Dg Finger Thumb Right  Result Date: 06/26/2018 CLINICAL DATA:  Laceration EXAM: RIGHT THUMB 2+V COMPARISON:  None. FINDINGS: There is no evidence of fracture or dislocation. There is no evidence of arthropathy or other focal bone abnormality. Soft tissues are unremarkable IMPRESSION: Negative. Electronically Signed   By: Jasmine Pang M.D.   On: 06/26/2018 22:22    Procedures Procedures (including critical care time)  Medications Ordered in ED Medications  HYDROcodone-acetaminophen (NORCO/VICODIN) 5-325 MG per tablet 1 tablet (1 tablet Oral Given 06/26/18 2103)  lidocaine (PF) (XYLOCAINE) 1 % injection 5 mL (5 mLs Infiltration Given by Other 06/26/18 2249)  HYDROcodone-acetaminophen (NORCO/VICODIN) 5-325 MG per tablet 1 tablet (1 tablet Oral Given 06/26/18 2248)  ondansetron (ZOFRAN-ODT) disintegrating tablet 4 mg (4 mg Oral Given 06/26/18 2300)  Tdap (BOOSTRIX) injection 0.5 mL (0.5 mLs Intramuscular Given 06/27/18 0049)     Initial Impression / Assessment and Plan / ED Course  I have reviewed the triage vital signs and the nursing notes.  Pertinent labs & imaging results that were available during my care of the patient were reviewed by me and considered in my medical decision making (see chart for details).  Clinical Course as of Jun 28 131  Mon Jun 26, 2018  2037 Patient verbally verified a safe ride from the ED. Proceeded with prescribing Norco for pain/relaxtion/muscle relaxation in the ED.   [AM]    Clinical Course User Index [AM] Aviva Kluver  B, PA-C   Wound irrigated extensively with 1 L of sterile saline.  Bleeding controlled with quick clot gauze and direct pressure.  Thumb was wrapped with stretch gauze and left covered.  Patient with sensation intact to thumb, movement intact, full range of motion and 5/5 strength to flexion and extension.  Capillary refill intact to left thumb after bandage placed.  Bandage placed so that tip of thumb is exposed, color is normal and capillary refill is intact, sensation intact.  This case was discussed with Dr. Anitra Lauth who agrees with quick clot gauze to be removed by patient tomorrow morning.  Patient aware that if bleeding continues after gauze removal tomorrow that she is to return to the emergency department for further evaluation and treatment.  Tdap given here department.  Patient given prescription for Norco at discharge, patient's sister-in-law is at bedside and confirms that she can stay with the patient after discharge while patient's husband is at work.  Patient's husband will be with the patient at all other times.  At this time there does not appear to be any evidence of an acute emergency medical condition and the patient appears stable for discharge with appropriate outpatient follow up. Diagnosis was discussed with patient who verbalizes understanding of care plan and is agreeable to discharge. I have discussed return precautions with patient and family at bedside who verbalize understanding of return precautions. Patient strongly encouraged to follow-up with their PCP. All questions answered.  Patient's case discussed with Dr. Tanna SavoyPlunket who agrees with plan to discharge with follow-up.     Note: Portions of this report may have been transcribed using voice recognition software. Every effort was made to ensure accuracy; however, inadvertent computerized transcription errors may still be present.  Final Clinical Impressions(s) / ED Diagnoses   Final diagnoses:  Laceration of right  thumb without foreign body, nail damage status unspecified, initial encounter    ED Discharge Orders         Ordered    HYDROcodone-acetaminophen (NORCO/VICODIN) 5-325 MG tablet  Every 6 hours PRN     06/27/18 0056           Bill SalinasMorelli, Caz Weaver A, PA-C 06/27/18 0133    Gwyneth SproutPlunkett, Whitney, MD 06/27/18 2208

## 2018-06-26 NOTE — ED Notes (Signed)
Pt moved to [place] for vitals check and observation after reporting weakness

## 2018-06-26 NOTE — ED Notes (Signed)
See provider assessment 

## 2018-06-26 NOTE — ED Provider Notes (Signed)
MSE was initiated and I personally evaluated the patient and placed orders (if any) at  8:37 PM on June 26, 2018.  The patient appears stable so that the remainder of the MSE may be completed by another provider.  Patient placed in Quick Look pathway, seen and evaluated   Chief Complaint: Laceration  HPI:   Patient is a 27 year old female with no significant past medical history presenting for laceration to the pad of the left thumb.  Patient reports that she cut on a potato peeler at 8 PM this evening.  Patient reports that she wrapped a towel around it, and is now stuck, and she cannot remove it.  Tetanus up-to-date.  ROS: See HPI (one)  Physical Exam:   Gen: No distress  Neuro: Awake and Alert  Skin: Warm    Focused Exam: Unable to fully assess laceration until towel is removed, attempting to irrigate with saline to remove.  No laceration to the dorsum of the left thumb noted.   Initiation of care has begun. The patient has been counseled on the process, plan, and necessity for staying for the completion/evaluation, and the remainder of the medical screening examination    Delia ChimesMurray, Alyssa B, PA-C 06/26/18 2038    Margarita Grizzleay, Danielle, MD 06/29/18 [phone]

## 2018-06-26 NOTE — ED Triage Notes (Signed)
Pt cut her right thumb with a knife this afternoon. About 3cm lac. Pt reports it is still bleeding, wrapped up in towel at triage. Pt reports 10/10 pain.

## 2018-06-27 MED ORDER — TETANUS-DIPHTH-ACELL PERTUSSIS 5-2.5-18.5 LF-MCG/0.5 IM SUSP
0.5000 mL | Freq: Once | INTRAMUSCULAR | Status: AC
  Administered 2018-06-27: 0.5 mL via INTRAMUSCULAR
  Filled 2018-06-27: qty 0.5

## 2018-06-27 MED ORDER — HYDROCODONE-ACETAMINOPHEN 5-325 MG PO TABS: 1.0000 | ORAL_TABLET | Freq: Four times a day (QID) | ORAL | 0 refills | Status: DC | PRN

## 2018-06-27 NOTE — Discharge Instructions (Addendum)
You may remove the bandage tomorrow morning when you wake up.  If there is still bleeding tomorrow please return to the emergency department for further evaluation and treatment. You may use the pain medication, Norco, as prescribed for severe pain.  Please do not drive or operate heavy machinery while taking this medication it will make you drowsy. Please follow-up with your primary care provider regarding your visit today. Please return to the emergency department for any new or worsening symptoms.  Solicite ayuda de inmediato si: Tiene mucha hinchazn alrededor de la herida. El dolor aumenta repentinamente y es intenso. Tiene bultos dolorosos cerca de la herida o en la piel en cualquier parte del cuerpo. Tiene una veta roja que sale de la herida. La herida est en la mano o en el pie y no puede mover correctamente uno de los dedos. La herida est en la mano o en el pie y [employer] Oneobserva que los dedos tienen un tono plido o [place].

## 2018-10-18 ENCOUNTER — Observation Stay (HOSPITAL_COMMUNITY)
Admission: EM | Admit: 2018-10-18 | Discharge: 2018-10-19 | Disposition: A | Discharge status: Home / Self Care | Payer: Medicaid Other | Attending: Internal Medicine | Admitting: Internal Medicine

## 2018-10-18 ENCOUNTER — Emergency Department (HOSPITAL_COMMUNITY): Payer: Medicaid Other

## 2018-10-18 ENCOUNTER — Other Ambulatory Visit: Payer: Self-pay

## 2018-10-18 ENCOUNTER — Encounter (HOSPITAL_COMMUNITY): Payer: Self-pay | Admitting: Emergency Medicine

## 2018-10-18 DIAGNOSIS — E890 Postprocedural hypothyroidism: Secondary | ICD-10-CM | POA: Diagnosis not present

## 2018-10-18 DIAGNOSIS — R6510 Systemic inflammatory response syndrome (SIRS) of non-infectious origin without acute organ dysfunction: Secondary | ICD-10-CM | POA: Diagnosis present

## 2018-10-18 DIAGNOSIS — R51 Headache: Secondary | ICD-10-CM | POA: Insufficient documentation

## 2018-10-18 DIAGNOSIS — Z888 Allergy status to other drugs, medicaments and biological substances status: Secondary | ICD-10-CM

## 2018-10-18 DIAGNOSIS — R05 Cough: Secondary | ICD-10-CM | POA: Diagnosis not present

## 2018-10-18 DIAGNOSIS — R110 Nausea: Secondary | ICD-10-CM | POA: Diagnosis present

## 2018-10-18 DIAGNOSIS — R509 Fever, unspecified: Secondary | ICD-10-CM

## 2018-10-18 DIAGNOSIS — E039 Hypothyroidism, unspecified: Secondary | ICD-10-CM

## 2018-10-18 DIAGNOSIS — J029 Acute pharyngitis, unspecified: Secondary | ICD-10-CM | POA: Diagnosis not present

## 2018-10-18 DIAGNOSIS — R000 Tachycardia, unspecified: Secondary | ICD-10-CM | POA: Diagnosis present

## 2018-10-18 DIAGNOSIS — E0590 Thyrotoxicosis, unspecified without thyrotoxic crisis or storm: Secondary | ICD-10-CM

## 2018-10-18 DIAGNOSIS — R6889 Other general symptoms and signs: Secondary | ICD-10-CM | POA: Insufficient documentation

## 2018-10-18 DIAGNOSIS — R079 Chest pain, unspecified: Secondary | ICD-10-CM | POA: Insufficient documentation

## 2018-10-18 DIAGNOSIS — Z79890 Hormone replacement therapy (postmenopausal): Secondary | ICD-10-CM | POA: Insufficient documentation

## 2018-10-18 DIAGNOSIS — Z8719 Personal history of other diseases of the digestive system: Secondary | ICD-10-CM

## 2018-10-18 DIAGNOSIS — N179 Acute kidney failure, unspecified: Secondary | ICD-10-CM

## 2018-10-18 LAB — CBC WITH DIFFERENTIAL/PLATELET
ABS IMMATURE GRANULOCYTES: 0.1 10*3/uL — AB (ref 0.00–0.07)
BASOS PCT: 0 %
Basophils Absolute: 0 10*3/uL (ref 0.0–0.1)
EOS PCT: 0 %
Eosinophils Absolute: 0 10*3/uL (ref 0.0–0.5)
HEMATOCRIT: 41.4 % (ref 36.0–46.0)
HEMOGLOBIN: 12.8 g/dL (ref 12.0–15.0)
Immature Granulocytes: 1 %
Lymphocytes Relative: 5 %
Lymphs Abs: 0.8 10*3/uL (ref 0.7–4.0)
MCH: 24.3 pg — ABNORMAL LOW (ref 26.0–34.0)
MCHC: 30.9 g/dL (ref 30.0–36.0)
MCV: 78.7 fL — ABNORMAL LOW (ref 80.0–100.0)
MONO ABS: 0.6 10*3/uL (ref 0.1–1.0)
Monocytes Relative: 3 %
Neutro Abs: 15.8 10*3/uL — ABNORMAL HIGH (ref 1.7–7.7)
Neutrophils Relative %: 91 %
Platelets: 184 10*3/uL (ref 150–400)
RBC: 5.26 MIL/uL — AB (ref 3.87–5.11)
RDW: 14.7 % (ref 11.5–15.5)
WBC: 17.3 10*3/uL — ABNORMAL HIGH (ref 4.0–10.5)
nRBC: 0 % (ref 0.0–0.2)

## 2018-10-18 LAB — I-STAT BETA HCG BLOOD, ED (MC, WL, AP ONLY): I-stat hCG, quantitative: <5 m[IU]/mL (ref <5)

## 2018-10-18 LAB — BASIC METABOLIC PANEL
Anion gap: 15 (ref 5–15)
BUN: 15 mg/dL (ref 6–20)
CHLORIDE: 100 mmol/L (ref 98–111)
CO2: 22 mmol/L (ref 22–32)
CREATININE: 1.17 mg/dL — AB (ref 0.44–1.00)
Calcium: 10.1 mg/dL (ref 8.9–10.3)
GFR calc non Af Amer: >60 mL/min (ref >60)
Glucose, Bld: 136 mg/dL — ABNORMAL HIGH (ref 70–99)
Potassium: 4 mmol/L (ref 3.5–5.1)
SODIUM: 137 mmol/L (ref 135–145)

## 2018-10-18 LAB — RESPIRATORY PANEL BY PCR
Adenovirus: NOT DETECTED
Bordetella pertussis: NOT DETECTED
CORONAVIRUS OC43-RVPPCR: NOT DETECTED
Chlamydophila pneumoniae: NOT DETECTED
Coronavirus 229E: NOT DETECTED
Coronavirus HKU1: NOT DETECTED
Coronavirus NL63: NOT DETECTED
Influenza A: NOT DETECTED
Influenza B: NOT DETECTED
Metapneumovirus: NOT DETECTED
Mycoplasma pneumoniae: NOT DETECTED
Parainfluenza Virus 1: NOT DETECTED
Parainfluenza Virus 2: NOT DETECTED
Parainfluenza Virus 3: NOT DETECTED
Parainfluenza Virus 4: NOT DETECTED
Respiratory Syncytial Virus: NOT DETECTED
Rhinovirus / Enterovirus: NOT DETECTED

## 2018-10-18 LAB — RAPID URINE DRUG SCREEN, HOSP PERFORMED
Amphetamines: NOT DETECTED
BARBITURATES: NOT DETECTED
Benzodiazepines: NOT DETECTED
COCAINE: NOT DETECTED
Opiates: NOT DETECTED
TETRAHYDROCANNABINOL: NOT DETECTED

## 2018-10-18 LAB — INFLUENZA PANEL BY PCR (TYPE A & B)
INFLAPCR: NEGATIVE
Influenza B By PCR: NEGATIVE

## 2018-10-18 LAB — URINALYSIS, ROUTINE W REFLEX MICROSCOPIC
Bilirubin Urine: NEGATIVE
GLUCOSE, UA: NEGATIVE mg/dL
Ketones, ur: NEGATIVE mg/dL
NITRITE: NEGATIVE
PROTEIN: NEGATIVE mg/dL
SPECIFIC GRAVITY, URINE: 1.018 (ref 1.005–1.030)
pH: 5 (ref 5.0–8.0)

## 2018-10-18 LAB — I-STAT CG4 LACTIC ACID, ED
LACTIC ACID, VENOUS: 2.49 mmol/L — AB (ref 0.5–1.9)
LACTIC ACID, VENOUS: 0.83 mmol/L (ref 0.5–1.9)

## 2018-10-18 LAB — MONONUCLEOSIS SCREEN: Mono Screen: NEGATIVE

## 2018-10-18 LAB — TSH: TSH: 0.058 u[IU]/mL — ABNORMAL LOW (ref 0.350–4.500)

## 2018-10-18 LAB — I-STAT TROPONIN, ED: TROPONIN I, POC: 0 ng/mL (ref 0.00–0.08)

## 2018-10-18 LAB — GROUP A STREP BY PCR: Group A Strep by PCR: NOT DETECTED

## 2018-10-18 LAB — T4, FREE: Free T4: 0.98 ng/dL (ref 0.82–1.77)

## 2018-10-18 MED ORDER — VANCOMYCIN HCL 10 G IV SOLR
1500.0000 mg | Freq: Once | INTRAVENOUS | Status: AC
  Administered 2018-10-18: 1500 mg via INTRAVENOUS
  Filled 2018-10-18: qty 1500

## 2018-10-18 MED ORDER — LEVOTHYROXINE SODIUM 25 MCG PO TABS
125.0000 ug | ORAL_TABLET | Freq: Every day | ORAL | Status: DC
  Administered 2018-10-19: 125 ug via ORAL
  Filled 2018-10-18: qty 1

## 2018-10-18 MED ORDER — VANCOMYCIN HCL IN DEXTROSE 750-5 MG/150ML-% IV SOLN
750.0000 mg | Freq: Two times a day (BID) | INTRAVENOUS | Status: DC
  Administered 2018-10-18: 750 mg via INTRAVENOUS
  Filled 2018-10-18 (×2): qty 150

## 2018-10-18 MED ORDER — PHENOL 1.4 % MT LIQD
1.0000 | OROMUCOSAL | Status: DC | PRN
  Administered 2018-10-18: 1 via OROMUCOSAL
  Filled 2018-10-18: qty 177

## 2018-10-18 MED ORDER — METRONIDAZOLE IN NACL 5-0.79 MG/ML-% IV SOLN
500.0000 mg | Freq: Three times a day (TID) | INTRAVENOUS | Status: DC
  Administered 2018-10-18: 500 mg via INTRAVENOUS
  Filled 2018-10-18: qty 100

## 2018-10-18 MED ORDER — VANCOMYCIN HCL IN DEXTROSE 750-5 MG/150ML-% IV SOLN
750.0000 mg | Freq: Two times a day (BID) | INTRAVENOUS | Status: DC
  Filled 2018-10-18 (×2): qty 150

## 2018-10-18 MED ORDER — ONDANSETRON 4 MG PO TBDP: 4.0000 mg | ORAL_TABLET | Freq: Three times a day (TID) | ORAL | 0 refills | Status: DC | PRN

## 2018-10-18 MED ORDER — ACETAMINOPHEN 500 MG PO TABS
1000.0000 mg | ORAL_TABLET | Freq: Once | ORAL | Status: AC
  Administered 2018-10-18: 1000 mg via ORAL
  Filled 2018-10-18: qty 2

## 2018-10-18 MED ORDER — SODIUM CHLORIDE 0.9 % IV BOLUS
1000.0000 mL | Freq: Once | INTRAVENOUS | Status: AC
  Administered 2018-10-18: 1000 mL via INTRAVENOUS

## 2018-10-18 MED ORDER — BENZONATATE 100 MG PO CAPS: 100.0000 mg | ORAL_CAPSULE | Freq: Three times a day (TID) | ORAL | 0 refills | Status: DC

## 2018-10-18 MED ORDER — INFLUENZA VAC SPLIT QUAD 0.5 ML IM SUSY
0.5000 mL | PREFILLED_SYRINGE | INTRAMUSCULAR | Status: DC
  Filled 2018-10-18 (×2): qty 0.5

## 2018-10-18 MED ORDER — IBUPROFEN 800 MG PO TABS
800.0000 mg | ORAL_TABLET | Freq: Once | ORAL | Status: AC
  Administered 2018-10-18: 800 mg via ORAL
  Filled 2018-10-18: qty 1

## 2018-10-18 MED ORDER — SODIUM CHLORIDE 0.9 % IV SOLN
INTRAVENOUS | Status: DC
  Administered 2018-10-18 – 2018-10-19 (×2): via INTRAVENOUS

## 2018-10-18 MED ORDER — IBUPROFEN 800 MG PO TABS: 800.0000 mg | ORAL_TABLET | Freq: Three times a day (TID) | ORAL | 0 refills | Status: DC

## 2018-10-18 MED ORDER — SODIUM CHLORIDE 0.9 % IV SOLN
2.0000 g | Freq: Three times a day (TID) | INTRAVENOUS | Status: DC
  Administered 2018-10-18 – 2018-10-19 (×2): 2 g via INTRAVENOUS
  Filled 2018-10-18 (×3): qty 2

## 2018-10-18 MED ORDER — VANCOMYCIN HCL IN DEXTROSE 1-5 GM/200ML-% IV SOLN: 1000.0000 mg | Freq: Once | INTRAVENOUS | Status: DC

## 2018-10-18 MED ORDER — ACETAMINOPHEN 325 MG PO TABS
650.0000 mg | ORAL_TABLET | Freq: Four times a day (QID) | ORAL | Status: DC | PRN
  Administered 2018-10-18 – 2018-10-19 (×3): 650 mg via ORAL
  Filled 2018-10-18 (×3): qty 2

## 2018-10-18 MED ORDER — SODIUM CHLORIDE 0.9 % IV SOLN
2.0000 g | Freq: Once | INTRAVENOUS | Status: AC
  Administered 2018-10-18: 2 g via INTRAVENOUS
  Filled 2018-10-18: qty 2

## 2018-10-18 MED ORDER — ENOXAPARIN SODIUM 40 MG/0.4ML ~~LOC~~ SOLN
40.0000 mg | Freq: Every day | SUBCUTANEOUS | Status: DC
  Administered 2018-10-19: 40 mg via SUBCUTANEOUS
  Filled 2018-10-18: qty 0.4

## 2018-10-18 MED ORDER — VANCOMYCIN HCL 10 G IV SOLR: 1500.0000 mg | Freq: Two times a day (BID) | INTRAVENOUS | Status: DC

## 2018-10-18 MED ORDER — ONDANSETRON HCL 4 MG/2ML IJ SOLN
4.0000 mg | Freq: Once | INTRAMUSCULAR | Status: AC
  Administered 2018-10-18: 4 mg via INTRAVENOUS
  Filled 2018-10-18: qty 2

## 2018-10-18 MED ORDER — METRONIDAZOLE IN NACL 5-0.79 MG/ML-% IV SOLN
500.0000 mg | Freq: Three times a day (TID) | INTRAVENOUS | Status: DC
  Administered 2018-10-18 – 2018-10-19 (×3): 500 mg via INTRAVENOUS
  Filled 2018-10-18 (×3): qty 100

## 2018-10-18 NOTE — ED Notes (Signed)
ED Provider at bedside. 

## 2018-10-18 NOTE — H&P (Addendum)
Date: 10/18/2018               Patient Name:  Margaret Walsh MRN: [id number]  DOB: 1991/03/18 Age / Sex: 27 y.o., female   PCP[facility]         Medical Service: Internal Medicine Teaching Service         Attending Physician: Dr. Inez Catalina, MD    First Contact: Dr. Petra Kuba  Pager: [phone]  Second Contact: Dr. Delma Officer Pager: [phone]       After Hours (After 5p/  First Contact Pager: [phone]  weekends / holidays): Second Contact Pager: [phone]   Chief Complaint: sore throat, headache, chest pain   History of Present Illness:   27 year old woman with history of hypothyroidism post thyroidectomy for goiter, remote history of pancreatitis. Last night she developed sore throat, headache, rigors, and chest pain. Her symptoms were associated with intermittent nausea but she denies vomiting, diarrhea, myalgias, congestion, cough, dysuria, increased urinary frequency. Her symptoms relieved at the time of presentation to the ED but she was left with residual sinus pressure and sore throat. One of her nephews has been sick and she believes she may have caught the same bug.  She did not get this years influenza vaccine.   In the ED she had a temperature 102.5 and was tachycardic with HR 160 at the time of presentation. Labs were notable for an elevated WBC count with neutrophil predominance, elevated lactic acid, and increased creatinine. Flu A and B screen were negative. The heart rate and lactic acid improved to 120 with multiple fluid boluses, IMTS was called to admit for further workup of tachycardia. She denies issue with tachycardia or   Meds:  Current Meds  Medication Sig  . levothyroxine (SYNTHROID, LEVOTHROID) 125 MCG tablet Take 125 mcg by mouth daily before breakfast.     Allergies: Allergies as of 10/18/2018 - Review Complete 10/18/2018  Allergen Reaction Noted  . Thyroid hormones  04/18/2018  . Methimazole Rash 01/03/2018   Past Medical  History:  Diagnosis Date  . Pancreatitis   . Thyroid disease     Family History: history of thyroid disease in multiple family members, grandfather had prostate cancer; mother, father, aunt, and grandmother had hypertension,; grandmother had diabetes; mother had "tachycardia"   Social History: denies alcohol or tobacco use. Smokes marijuana on occasion, denies illicit drug use.   Review of Systems: A complete ROS was negative except as per HPI.   Physical Exam: Blood pressure (!) 110/58, pulse (!) 117, temperature 98.9 F (37.2 C), temperature source Oral, resp. rate 18, weight 77.1 kg, SpO2 98 %. General: tired appearing, no acute distress  HENT: no maxillary or frontal sinus tenderness, no tonsillar exudate or plaques, no palpable cervical adenopathy, there is a linear scar over the front of the neck  Eyes: non icteric, conjunctiva are well perfused.  Cardiac: elevated rate, regular rhythm, no murmurs, rubs or gallops, no peripheral edema  Pulm: normal work of breathing, lungs clear to ausculation  GI: abdomen is soft, non tender, non distended  Psych: conversational, normal affect  Skin: no rashes or skin changes evident over the exposed skin of the arms face or back   EKG: personally reviewed my interpretation is rate 156, sinus tachycardia  CXR: personally reviewed my interpretation is good inspiratory effort, no consolidation or infiltrate   Assessment & Plan by Problem: Active Problems:   Sinus tachycardia [name] is a 27  year old woman with history of post surgical hypothyroidism presents with symptoms of viral infection and was found to have an elevated leukocyte count with neutrophil predominance, lactic acid, creatinine and sinus tachycardia. Centor score is 2 for fever and absence of cough and she had negative strep PCR. Mono screen and respiratory viral panel were negative. Constellation of symptoms is suggestive of viral etiology, however the neutrophil predominant  leukocytosis does not match up with this. We will watch her closely while monitoring and attempting to correct this tachycardia which is likely related to her fever.  - telemetry monitoring  - follow up HIV RNA and antibody  - tylenol for fevers  - continue broad spectrum antibiotics.  - follow up CBC and blood cultures  - if she has a rapid change overnight we should consider imaging of the sinuses and abdomen.   Hyperthyroid  TSH is low, will follow up T3 and T4 which are in process.  - continue levothyroxine 125 mg daily  Dispo: Admit patient to Observation with expected length of stay less than 2 midnights.  Signed: Eulah PontBlum, Sherran Margolis, MD 10/18/2018, 9:36 AM  Pager: [phone]

## 2018-10-18 NOTE — ED Notes (Signed)
[name] at bedside discussing admission with pt and family via interpreter.

## 2018-10-18 NOTE — ED Provider Notes (Signed)
[facility] EMERGENCY DEPARTMENT Provider Note   CSN: [id number] Arrival date & time: 10/18/18  0344     History   Chief Complaint Chief Complaint  Patient presents with  . Fatigue  . Sore Throat  . Generalized Body Aches  . Chest Pain    HPI [name] is a 27 y.o. female.  The history is provided by the patient and medical records.  Sore Throat  Associated symptoms include chest pain.  Chest Pain   Associated symptoms include cough, a fever, nausea and vomiting.     27 year old female with history of thyroid disease status post thyroidectomy, presenting to the ED with fatigue, generalized weakness, body aches, sore throat, nausea, and fever since yesterday.  History is obtained via language interpreter as patient primarily speaks [place].  States she woke up with symptoms yesterday.  She reports she had a wet cough with some chest pain.  She denies any shortness of breath.  She has had some sick contacts.  She took some over-the-counter cold medication without much change.  States the only other medication she is taken is her thyroid medication.  Unsure the last time her levels were checked.  Patient was found to have a heart rate of 160 in triage.  Initial temp 37F in triage, temp of 102F after placement in room.  Past Medical History:  Diagnosis Date  . Pancreatitis   . Thyroid disease     There are no active problems to display for this patient.   Past Surgical History:  Procedure Laterality Date  . THYROIDECTOMY       OB History   None      Home Medications    Prior to Admission medications   Medication Sig Start Date End Date Taking? Authorizing Provider  HYDROcodone-acetaminophen (NORCO/VICODIN) 5-325 MG tablet Take 1 tablet by mouth every 6 (six) hours as needed. 06/27/18   Harlene Salts A, PA-C  levothyroxine (SYNTHROID, LEVOTHROID) 125 MCG tablet Take 125 mcg by mouth daily before breakfast.    [provider]    naproxen (NAPROSYN) 500 MG tablet Take 1 tablet (500 mg total) by mouth 2 (two) times daily. 05/15/18   Antony Madura, PA-C  ondansetron (ZOFRAN ODT) 4 MG disintegrating tablet Take 1 tablet (4 mg total) by mouth every 8 (eight) hours as needed for nausea or vomiting. 05/15/18   Antony Madura, PA-C  oxyCODONE-acetaminophen (PERCOCET/ROXICET) 5-325 MG tablet Take 1-2 tablets by mouth every 6 (six) hours as needed for severe pain. 05/15/18   Antony Madura, PA-C  ranitidine (ZANTAC) 150 MG tablet Take 1 tablet (150 mg total) by mouth 2 (two) times daily. 05/17/17   Gilda Crease, MD    Family History No family history on file.  Social History Social History   Tobacco Use  . Smoking status: Former Games developer  . Smokeless tobacco: Never Used  Substance Use Topics  . Alcohol use: No  . Drug use: Yes    Types: Marijuana     Allergies   Thyroid hormones   Review of Systems Review of Systems  Constitutional: Positive for chills and fever.  HENT: Positive for congestion and sore throat.   Respiratory: Positive for cough.   Cardiovascular: Positive for chest pain.  Gastrointestinal: Positive for nausea and vomiting.  Musculoskeletal: Positive for myalgias.  All other systems reviewed and are negative.    Physical Exam Updated Vital Signs BP 123/72 (BP Location: Right Arm)   Pulse (!) 160   Temp 98.7 F (  37.1 C) (Oral)   Wt 77.1 kg   SpO2 100%   BMI 30.11 kg/m   Physical Exam  Constitutional: She is oriented to person, place, and time. She appears well-developed and well-nourished.  Warm to touch  HENT:  Head: Normocephalic and atraumatic.  Right Ear: Tympanic membrane and ear canal normal.  Left Ear: Tympanic membrane and ear canal normal.  Nose: Mucosal edema present.  Mouth/Throat: Oropharynx is clear and moist.  Tonsils overall normal in appearance bilaterally without exudate; posterior oropharynx is erythematous; uvula midline without evidence of peritonsillar abscess;  handling secretions appropriately; no difficulty swallowing or speaking; normal phonation without stridor  Eyes: Pupils are equal, round, and reactive to light. Conjunctivae and EOM are normal.  Neck: Normal range of motion.  Cardiovascular: Regular rhythm and normal heart sounds. Tachycardia present.  Pulmonary/Chest: Effort normal and breath sounds normal. She has no decreased breath sounds. She has no wheezes. She has no rhonchi.  Abdominal: Soft. Bowel sounds are normal.  Musculoskeletal: Normal range of motion.  Neurological: She is alert and oriented to person, place, and time.  Skin: Skin is warm and dry.  Psychiatric: She has a normal mood and affect.  Nursing note and vitals reviewed.    ED Treatments / Results  Labs (all labs ordered are listed, but only abnormal results are displayed) Labs Reviewed  CBC WITH DIFFERENTIAL/PLATELET - Abnormal; Notable for the following components:      Result Value   WBC 17.3 (*)    RBC 5.26 (*)    MCV 78.7 (*)    MCH 24.3 (*)    Neutro Abs 15.8 (*)    Abs Immature Granulocytes 0.10 (*)    All other components within normal limits  BASIC METABOLIC PANEL - Abnormal; Notable for the following components:   Glucose, Bld 136 (*)    Creatinine, Ser 1.17 (*)    All other components within normal limits  TSH - Abnormal; Notable for the following components:   TSH 0.058 (*)    All other components within normal limits  I-STAT CG4 LACTIC ACID, ED - Abnormal; Notable for the following components:   Lactic Acid, Venous 2.49 (*)    All other components within normal limits  GROUP A STREP BY PCR  INFLUENZA PANEL BY PCR (TYPE A & B)  MONONUCLEOSIS SCREEN  RAPID URINE DRUG SCREEN, HOSP PERFORMED  URINALYSIS, ROUTINE W REFLEX MICROSCOPIC  I-STAT TROPONIN, ED  I-STAT BETA HCG BLOOD, ED ([facility], [facility], [facility] ONLY)  I-STAT CG4 LACTIC ACID, ED    EKG None  Radiology Dg Chest Port 1 View  Result Date: 10/18/2018 CLINICAL DATA:  Cough, chest pain and  body aches since yesterday. Tachycardia. EXAM: PORTABLE CHEST 1 VIEW COMPARISON:  None. FINDINGS: The heart size and mediastinal contours are within normal limits. Both lungs are clear. The visualized skeletal structures are unremarkable. IMPRESSION: No active disease. Electronically Signed   By: Awilda Metroourtnay  Bloomer M.D.   On: 10/18/2018 04:39    Procedures Procedures (including critical care time)  Medications Ordered in ED Medications  sodium chloride 0.9 % bolus 1,000 mL (1,000 mLs Intravenous New Bag/Given 10/18/18 0410)  acetaminophen (TYLENOL) tablet 1,000 mg (1,000 mg Oral Given 10/18/18 0437)  sodium chloride 0.9 % bolus 1,000 mL (1,000 mLs Intravenous New Bag/Given 10/18/18 0533)  ibuprofen (ADVIL,MOTRIN) tablet 800 mg (800 mg Oral Given 10/18/18 [id number])     Initial Impression / Assessment and Plan / ED Course  I have reviewed the triage vital signs and  the nursing notes.  Pertinent labs & imaging results that were available during my care of the patient were reviewed by me and considered in my medical decision making (see chart for details).  27 y.o. F here with flu like symptoms for the past 24 hours.  She reports cough, body aches, fever, chills, nausea, vomiting, and headache.  She is febrile here, tachycardic to 160's on initial exam but slowly coming down once in bed.  Lungs are CTAB.  Mild erythema of posterior oropharynx without tonsillar edema or exudates.  Suspect viral process, however given significant tachycardia, will require formal work-up.  Basic labs sent along with influenza swab, rapid strep, monospot, TSH, lactic acid, UA, UDS.  CXR also ordered.  Given IVF, tylenol for fever.  Will monitor.  5:42 AM HR decreased with fluids.  Down to 120's with first liter, additional liter ordered.  Thus far, work-up is reassuring.  She does have leukocytosis and mildly elevated lactic acid, however CXR, rapid strep, flu, and monospot all negative.  This may be reactive from vomiting.   Awaiting UA and UDS along with repeat lactate.  TSH is low-- she has been taking her prescribed synthroid but no recent follow-up about this.  She is s/p thyroidectomy.  Will need follow-up about this.  IVF continued, additional motrin given for ongoing fever.  6:34 AM Patient signed out to oncoming team.  Awaiting UA, UDS, repeat lactate.  If HR improving and fever controlled, feel she is stable for d/c home with symptomatic care.  She will need close FU with PCP regarding her thyroid.  Final Clinical Impressions(s) / ED Diagnoses   Final diagnoses:  Flu-like symptoms  Nausea  Fever, unspecified fever cause    ED Discharge Orders         Ordered    ibuprofen (ADVIL,MOTRIN) 800 MG tablet  3 times daily     10/18/18 0616    benzonatate (TESSALON) 100 MG capsule  Every 8 hours     10/18/18 0616    ondansetron (ZOFRAN ODT) 4 MG disintegrating tablet  Every 8 hours PRN     10/18/18 0616           Garlon Hatchet, PA-C 10/18/18 1610    Gilda Crease, MD 10/18/18 [phone]

## 2018-10-18 NOTE — ED Provider Notes (Signed)
6:27 AM BP 112/63   Pulse (!) 125   Temp (!) 101.6 F (38.7 C) (Oral)   Resp 20   Wt 77.1 kg   SpO2 98%   BMI 30.11 kg/m  Patient taken in sign out from PA Sanders. Patient here with flu like sxs. Elevated HR fever. Here labs are as below. She is taking thyroid meds and has not had her thyroid fx recently checked. Plan rehdyration- fever control with expectant dc  Results for orders placed or performed during the hospital encounter of 10/18/18  Group A Strep by PCR  Result Value Ref Range   Group A Strep by PCR NOT DETECTED NOT DETECTED  CBC with Differential  Result Value Ref Range   WBC 17.3 (H) 4.0 - 10.5 K/uL   RBC 5.26 (H) 3.87 - 5.11 MIL/uL   Hemoglobin 12.8 12.0 - 15.0 g/dL   HCT 91.4 78.2 - 95.6 %   MCV 78.7 (L) 80.0 - 100.0 fL   MCH 24.3 (L) 26.0 - 34.0 pg   MCHC 30.9 30.0 - 36.0 g/dL   RDW 21.3 08.6 - 57.8 %   Platelets 184 150 - 400 K/uL   nRBC 0.0 0.0 - 0.2 %   Neutrophils Relative % 91 %   Neutro Abs 15.8 (H) 1.7 - 7.7 K/uL   Lymphocytes Relative 5 %   Lymphs Abs 0.8 0.7 - 4.0 K/uL   Monocytes Relative 3 %   Monocytes Absolute 0.6 0.1 - 1.0 K/uL   Eosinophils Relative 0 %   Eosinophils Absolute 0.0 0.0 - 0.5 K/uL   Basophils Relative 0 %   Basophils Absolute 0.0 0.0 - 0.1 K/uL   WBC Morphology VACUOLATED NEUTROPHILS    Immature Granulocytes 1 %   Abs Immature Granulocytes 0.10 (H) 0.00 - 0.07 K/uL   Tear Drop Cells PRESENT    Polychromasia PRESENT   Basic metabolic panel  Result Value Ref Range   Sodium 137 135 - 145 mmol/L   Potassium 4.0 3.5 - 5.1 mmol/L   Chloride 100 98 - 111 mmol/L   CO2 22 22 - 32 mmol/L   Glucose, Bld 136 (H) 70 - 99 mg/dL   BUN 15 6 - 20 mg/dL   Creatinine, Ser 4.69 (H) 0.44 - 1.00 mg/dL   Calcium 62.9 8.9 - 52.8 mg/dL   GFR calc non Af Amer >60 >60 mL/min   GFR calc Af Amer >60 >60 mL/min   Anion gap 15 5 - 15  TSH  Result Value Ref Range   TSH 0.058 (L) 0.350 - 4.500 uIU/mL  Influenza panel by PCR (type A & B)   Result Value Ref Range   Influenza A By PCR NEGATIVE NEGATIVE   Influenza B By PCR NEGATIVE NEGATIVE  Mononucleosis screen  Result Value Ref Range   Mono Screen NEGATIVE NEGATIVE  I-stat troponin, ED  Result Value Ref Range   Troponin i, poc 0.00 0.00 - 0.08 ng/mL   Comment 3          I-Stat CG4 Lactic Acid, ED  Result Value Ref Range   Lactic Acid, Venous 2.49 (HH) 0.5 - 1.9 mmol/L   Comment NOTIFIED PHYSICIAN   I-Stat Beta hCG blood, ED ([facility], [facility], [facility] only)  Result Value Ref Range   I-stat hCG, quantitative <5.0 <5 mIU/mL   Comment 3              Physical Exam  BP 112/63   Pulse (!) 125   Temp (!) 101.6  F (38.7 C) (Oral)   Resp 20   Wt 77.1 kg   SpO2 98%   BMI 30.11 kg/m   Physical Exam  Constitutional: She is oriented to person, place, and time. She appears well-developed and well-nourished. No distress.  HENT:  Head: Normocephalic and atraumatic.  Eyes: Conjunctivae are normal. No scleral icterus.  Neck: Normal range of motion.  Cardiovascular: Regular rhythm and normal heart sounds. Exam reveals no gallop and no friction rub.  No murmur heard. TACHYCARDIC   Pulmonary/Chest: Effort normal and breath sounds normal. No respiratory distress.  Abdominal: Soft. Bowel sounds are normal. She exhibits no distension and no mass. There is no tenderness. There is no guarding.  Neurological: She is alert and oriented to person, place, and time.  Skin: Skin is warm and dry. She is not diaphoretic.  Psychiatric: Her behavior is normal.  Nursing note and vitals reviewed.   ED Course/Procedures   Clinical Course as of Oct 18 1046  Wed Oct 18, 2018  0735 Appears contaminated   Urinalysis, Routine w reflex microscopic(!) [AH]  0750 Tear Drop Cells: PRESENT [EM]  0751 Tear Drop Cells: PRESENT [EM]  0751 Tear Drop Cells: PRESENT [EM]  0751 Polychromasia: PRESENT [EM]  [id number] Benzodiazepines: NONE DETECTED [AH]  [id number] Amphetamines: NONE DETECTED [AH]    Clinical Course  User Index [AH] Arthor CaptainHarris, Llesenia Fogal, PA-C [EM] Lia FoyerMaru, Emily, Wisconsintudent-PA    .Critical Care Performed by: Arthor CaptainHarris, Monserrat Vidaurri, PA-C Authorized by: Arthor CaptainHarris, Lauranne Beyersdorf, PA-C   Critical care provider statement:    Critical care time (minutes):  45   Critical care was necessary to treat or prevent imminent or life-threatening deterioration of the following conditions:  Sepsis   Critical care was time spent personally by me on the following activities:  Development of treatment plan with patient or surrogate, discussions with consultants, evaluation of patient's response to treatment, examination of patient, ordering and review of laboratory studies, ordering and review of radiographic studies, re-evaluation of patient's condition, review of old charts and obtaining history from patient or surrogate    MDM  Margaret Walsh is a 27 year old female primarily Spanish-speaking who arrived today with flulike symptoms.  She felt an early sepsis picture with tachycardia, fever, elevated white blood cell count.  Work-up was negative.  Patient's fever has resolved, her lactate has cleared however she is persistently tachycardic despite interventions.  Patient thyroid-stimulating hormone is also markedly low.  I doubt this is a thyrotoxicosis picture in the setting of low blood pressures.  She has a previous thyroidectomy.  The patient may have early sepsis versus Sirs and I have ordered blood cultures, broad-spectrum antibiotics and fluids.  Patient will be admitted for observation by the internal medicine teaching service.  She has improved throughout her course here in the emergency department.       Arthor CaptainHarris, Linlee Cromie, PA-C 10/18/18 1049    Gilda CreasePollina, Christopher J, MD 10/26/18 [phone]

## 2018-10-18 NOTE — ED Notes (Addendum)
Admitting at bedside. Urine culture and resp panel added on.

## 2018-10-18 NOTE — Discharge Instructions (Addendum)
Take the prescribed medication as directed.  Continue tylenol/motrin for fever. Make sure to rest and drink lots of fluids. Follow-up with your primary care doctor. Return to the ED for new or worsening symptoms.

## 2018-10-18 NOTE — ED Triage Notes (Signed)
Patient reports cough, chest pain, sore throat and body aches since yesterday, reports she took some otc cold medication for her symptoms, patients HR 160 initially upon triage. Pt a/o x4, resp e/u, nad. Spanish interpretor utilized for triage.

## 2018-10-18 NOTE — ED Notes (Signed)
[name] notified this RN to hold off on d/c. Pt already in her clothes and IV already removed. Pt informed of this and is sitting on stretcher.

## 2018-10-18 NOTE — ED Notes (Signed)
Pt getting dressed, IV removed and [facility]

## 2018-10-18 NOTE — ED Notes (Signed)
Pt moved to progressive bed, pt then ambulated to the restroom independently without difficulty. Pt states she feels much better, denies pain, no complaints at this time. HR 120s sinus tach. Axox4.

## 2018-10-19 DIAGNOSIS — J029 Acute pharyngitis, unspecified: Secondary | ICD-10-CM | POA: Diagnosis not present

## 2018-10-19 DIAGNOSIS — D72829 Elevated white blood cell count, unspecified: Secondary | ICD-10-CM | POA: Diagnosis not present

## 2018-10-19 DIAGNOSIS — R000 Tachycardia, unspecified: Secondary | ICD-10-CM | POA: Diagnosis not present

## 2018-10-19 DIAGNOSIS — E039 Hypothyroidism, unspecified: Secondary | ICD-10-CM

## 2018-10-19 DIAGNOSIS — N179 Acute kidney failure, unspecified: Secondary | ICD-10-CM

## 2018-10-19 LAB — CBC
HCT: 34.9 % — ABNORMAL LOW (ref 36.0–46.0)
Hemoglobin: 10.7 g/dL — ABNORMAL LOW (ref 12.0–15.0)
MCH: 23.7 pg — AB (ref 26.0–34.0)
MCHC: 30.7 g/dL (ref 30.0–36.0)
MCV: 77.2 fL — ABNORMAL LOW (ref 80.0–100.0)
Platelets: 159 10*3/uL (ref 150–400)
RBC: 4.52 MIL/uL (ref 3.87–5.11)
RDW: 15 % (ref 11.5–15.5)
WBC: 13.7 10*3/uL — ABNORMAL HIGH (ref 4.0–10.5)
nRBC: 0 % (ref 0.0–0.2)

## 2018-10-19 LAB — BASIC METABOLIC PANEL
Anion gap: 12 (ref 5–15)
BUN: 6 mg/dL (ref 6–20)
CO2: 20 mmol/L — AB (ref 22–32)
Calcium: 8.4 mg/dL — ABNORMAL LOW (ref 8.9–10.3)
Chloride: 104 mmol/L (ref 98–111)
Creatinine, Ser: 0.79 mg/dL (ref 0.44–1.00)
GFR calc Af Amer: >60 mL/min (ref >60)
GFR calc non Af Amer: >60 mL/min (ref >60)
Glucose, Bld: 116 mg/dL — ABNORMAL HIGH (ref 70–99)
Potassium: 3.5 mmol/L (ref 3.5–5.1)
Sodium: 136 mmol/L (ref 135–145)

## 2018-10-19 LAB — URINE CULTURE: Culture: <10000 — AB

## 2018-10-19 LAB — HIV ANTIBODY (ROUTINE TESTING W REFLEX): HIV Screen 4th Generation wRfx: NONREACTIVE

## 2018-10-19 LAB — T3, FREE: T3, Free: 2.3 pg/mL (ref 2.0–4.4)

## 2018-10-19 MED ORDER — AMOXICILLIN 500 MG PO TABS: 500.0000 mg | ORAL_TABLET | Freq: Two times a day (BID) | ORAL | 0 refills | Status: DC

## 2018-10-19 MED ORDER — ACETAMINOPHEN 325 MG PO TABS: 650.0000 mg | ORAL_TABLET | ORAL | 0 refills | Status: AC | PRN

## 2018-10-19 MED ORDER — SODIUM CHLORIDE 0.9 % IV SOLN
2.0000 g | Freq: Two times a day (BID) | INTRAVENOUS | Status: DC
  Filled 2018-10-19: qty 2

## 2018-10-19 MED ORDER — ONDANSETRON 4 MG PO TBDP: 4.0000 mg | ORAL_TABLET | Freq: Three times a day (TID) | ORAL | 0 refills | Status: DC | PRN

## 2018-10-19 NOTE — Progress Notes (Signed)
   Subjective:  NAEON. Feeling better. Endorsing pain in the back of her neck, mild headache, and pressure behind her eyes. Eating and drinking well.   Objective:  Vital signs in last 24 hours: Vitals:   10/18/18 1500 10/18/18 1845 10/18/18 2331 10/19/18 0632  BP:  118/68 (!) 107/59 115/67  Pulse:  (!) 117 (!) 118 (!) 112  Resp:  18 18 16   Temp:  98.9 F (37.2 C) 98.8 F (37.1 C) 99.2 F (37.3 C)  TempSrc:  Oral Oral Oral  SpO2:  100% 97% 98%  Weight:      Height: 5\' 3"  (1.6 m)      Gen: sitting up in bed HEENT: Oropharynx is erythematous with bilateral white tonsillar exudates Cardiac: Tachycardic, regular rhythm Pulmonary: CTA B Abdomen: Soft, nontender Extremities: Warm, no edema, no rash  Assessment/Plan:  Active Problems:   Sinus tachycardia   Hypothyroidism   Acute kidney injury (HCC)   Fever  27 year old female with hypothyroidism and remote history of pancreatitis presented with 2 days of rigors, nausea, fever, sinus pressure, sore throat.  1.  Febrile illness: Fever curve has improved.  Last fever was yesterday afternoon.  Still tachycardic but has improved. Tolerating po. Leukocytosis is improving. Only localizing exam finding is erythematous oropharynx with bilateral tonsillar white exudates. Strep test was negative, but could be false negative - BCx no growth at one day, UCx negative - f/u throat culture - deescalate antibiotics - Patient is improving, will treat for presumed strep with amoxicillin and discharge to home - call patient if cultures become positive  2.  AKI: Resolved with fluids  3.  Thyroid disease: TSH was low but free T4 and T3 were normal.  Recommend outpatient recheck once she is over this acute illness.  Dispo: Anticipated discharge in approximately today  Ali LoweVogel, Ellory Khurana S, MD 10/19/2018, 11:25 AM Pager: [phone]

## 2018-10-19 NOTE — Progress Notes (Signed)
PHARMACY NOTE:  ANTIMICROBIAL RENAL DOSAGE ADJUSTMENT  Current antimicrobial regimen includes a mismatch between antimicrobial dosage and estimated renal function.  As per policy approved by the Pharmacy & Therapeutics and Medical Executive Committees, the antimicrobial dosage will be adjusted accordingly.  Current antimicrobial dosage:  Cefepime 2g IV every 8 hours  Indication: Febrile illness of unk source  Renal Function:  Estimated Creatinine Clearance: 105.7 mL/min (by C-G formula based on SCr of 0.79 mg/dL). []      On intermittent HD, scheduled: []      On CRRT    Antimicrobial dosage has been changed to:  Cefepime 2g IV every 12 hours  Additional comments: - The patient is low risk for Pseudomonas  Georgina PillionElizabeth Chisom Muntean, PharmD, BCPS Pager: [phone] 9:57 AM

## 2018-10-19 NOTE — Progress Notes (Signed)
-  late entry Pharmacy Antibiotic Note  Margaret Walsh is a 27 y.o. female admitted on 10/18/2018 withPharmacy has been consulted for Vancomycin dosing for Leukocytosis with neutrophil predominance/ Fever Vancomycin 1.5 g IV x1 given in ED.  Also on Cefepime 2 g IV q8h and IV Flagyl.  Scr 1.17  Plan: Vancomycin 750 mg IV q12h Monitor clinical status, renal function and culture results daily.    Height: 5\' 3"  (160 cm)(from previous data 06/2018) Weight: 170 lb (77.1 kg) IBW/kg (Calculated) : 52.4  Temp (24hrs), Avg:98.9 F (37.2 C), Min:98.8 F (37.1 C), Max:99.2 F (37.3 C)  Recent Labs  Lab 10/18/18 0408 10/18/18 0437 10/18/18 0710 10/19/18 0408  WBC 17.3*  --   --  13.7*  CREATININE 1.17*  --   --  0.79  LATICACIDVEN  --  2.49* 0.83  --     Estimated Creatinine Clearance: 105.7 mL/min (by C-G formula based on SCr of 0.79 mg/dL).    Allergies  Allergen Reactions  . Thyroid Hormones     Allergic to unknown thyroid med  . Methimazole Rash    Thank you for allowing pharmacy to be a part of this patient's care.  Noah Delaineuth Daily Crate, RPh Clinical Pharmacist Please check AMION for all [facility] phone numbers After 10:00 PM, call Main Pharmacy [phone] 10/19/2018 3:25 PM late entry ( consult done on 12/4/201 4:40 PM)

## 2018-10-19 NOTE — Progress Notes (Signed)
Pt stable, ambulatory, and verbalizes understanding of d/c instructions.  

## 2018-10-19 NOTE — Progress Notes (Signed)
Spoke with provider in regards to Strep Culture.  Will cancel culture since PCR screen is definitive.

## 2018-10-19 NOTE — Discharge Summary (Signed)
Name: Margaret Walsh MRN: [id number] DOB: 09/23/1991 27 y.o. PCP: [facility]  Date of Admission: 10/18/2018  3:50 AM Date of Discharge: 10/19/2018 Attending Physician: No att. providers found  Discharge Diagnosis: 1. Febrile Illness, unknown source  2. Hypothyroidism   Discharge Medications: Allergies as of 10/19/2018      Reactions   Thyroid Hormones    Allergic to unknown thyroid med   Methimazole Rash      Medication List    STOP taking these medications   HYDROcodone-acetaminophen 5-325 MG tablet Commonly known as:  NORCO/VICODIN   naproxen 500 MG tablet Commonly known as:  NAPROSYN   oxyCODONE-acetaminophen 5-325 MG tablet Commonly known as:  PERCOCET/ROXICET   ranitidine 150 MG tablet Commonly known as:  ZANTAC     TAKE these medications   acetaminophen 325 MG tablet Commonly known as:  TYLENOL Take 2 tablets (650 mg total) by mouth every 4 (four) hours as needed.   amoxicillin 500 MG tablet Commonly known as:  AMOXIL Take 1 tablet (500 mg total) by mouth 2 (two) times daily for 10 days.   benzonatate 100 MG capsule Commonly known as:  TESSALON Take 1 capsule (100 mg total) by mouth every 8 (eight) hours.   ibuprofen 800 MG tablet Commonly known as:  ADVIL,MOTRIN Take 1 tablet (800 mg total) by mouth 3 (three) times daily.   levothyroxine 125 MCG tablet Commonly known as:  SYNTHROID, LEVOTHROID Take 125 mcg by mouth daily before breakfast.   ondansetron 4 MG disintegrating tablet Commonly known as:  ZOFRAN-ODT Take 1 tablet (4 mg total) by mouth every 8 (eight) hours as needed for nausea. What changed:  reasons to take this       Disposition and follow-up:   [name] was discharged from [facility] in Stable condition.  At the hospital follow up visit please address:  1.  Ask about residual flu like symptoms. Did she complete her 10 day course of amoxicillin after discharge?  2.  Labs /  imaging needed at time of follow-up: TSH (it was low, but T4 and T3 were WNLs)  3.  Pending labs/ test needing follow-up: HIV antibody and RNA quant  Follow-up Appointments: Follow-up Information    [facility]. Schedule an appointment as soon as possible for a visit in 1 week(s).   Specialty:  Pediatrics Contact information: [address] [place] [address] [phone]           Hospital Course by problem list: 27 year old Spanish-speaking female with hypothyroidism status post thyroidectomy for goiter, remote history of pancreatitis who presented with flulike symptoms consisting of sore throat, sinus pressure, headache, rigors, chest pain.  She was tachycardic in the 140s and febrile to 103 F, normotensive.  Exam was revealed erythematous oropharynx with bilateral white tonsillar exudates.  Labs showed a leukocytosis with neutrophil predominance.  Extensive work-up revealed no clear source or cause of infection.  She was treated empirically with broad-spectrum antibiotics (vancomycin, cefepime, metronidazole).  Fever curve improved overnight.  No new symptoms arise.  Tachycardia improved but was still in the low 100s at discharge.  She was treated for presumed strep (despite negative PCR testing) and discharged with a 10 day course of amoxicillin.   Thyroid Disease: Recommend rechecking TSH after current illness resolves. Concern for overtreatment of hypothyroidism given TSH was low at 0.05 but free T4 and T3 were within normal limits.   Discharge Vitals:   BP  125/70 (BP Location: Left Arm)   Pulse (!) 112   Temp 98.8 F (37.1 C) (Oral)   Resp 18   Ht 5\' 3"  (1.6 m) Comment: from previous data 06/2018  Wt 77.1 kg   LMP 09/18/2018   SpO2 99%   BMI 30.11 kg/m   Pertinent Labs, Studies, and Procedures:  Results for orders placed or performed during the hospital encounter of 10/18/18  Group A Strep by PCR     Status: None   Collection Time:  10/18/18  4:15 AM  Result Value Ref Range Status   Group A Strep by PCR NOT DETECTED NOT DETECTED Final    Comment: Performed at [facility], [address] [place], [place] [address]  Respiratory Panel by PCR     Status: None   Collection Time: 10/18/18  4:18 AM  Result Value Ref Range Status   Adenovirus NOT DETECTED NOT DETECTED Final   Coronavirus 229E NOT DETECTED NOT DETECTED Final   Coronavirus HKU1 NOT DETECTED NOT DETECTED Final   Coronavirus NL63 NOT DETECTED NOT DETECTED Final   Coronavirus OC43 NOT DETECTED NOT DETECTED Final   Metapneumovirus NOT DETECTED NOT DETECTED Final   Rhinovirus / Enterovirus NOT DETECTED NOT DETECTED Final   Influenza A NOT DETECTED NOT DETECTED Final   Influenza B NOT DETECTED NOT DETECTED Final   Parainfluenza Virus 1 NOT DETECTED NOT DETECTED Final   Parainfluenza Virus 2 NOT DETECTED NOT DETECTED Final   Parainfluenza Virus 3 NOT DETECTED NOT DETECTED Final   Parainfluenza Virus 4 NOT DETECTED NOT DETECTED Final   Respiratory Syncytial Virus NOT DETECTED NOT DETECTED Final   Bordetella pertussis NOT DETECTED NOT DETECTED Final   Chlamydophila pneumoniae NOT DETECTED NOT DETECTED Final   Mycoplasma pneumoniae NOT DETECTED NOT DETECTED Final    Comment: Performed at [facility], [address] [place], [place] [address]  Urine culture     Status: Abnormal   Collection Time: 10/18/18  6:30 AM  Result Value Ref Range Status   Specimen Description URINE, RANDOM  Final   Special Requests NONE  Final   Culture (A)  Final    <10,000 COLONIES/mL INSIGNIFICANT GROWTH Performed at [facility], [address] [place], [place] [address]    Report Status 10/19/2018 FINAL  Final  Blood Culture (routine x 2)     Status: None (Preliminary result)   Collection Time: 10/18/18  8:41 AM  Result Value Ref Range Status   Specimen Description BLOOD LEFT ANTECUBITAL  Final   Special Requests   Final    BOTTLES DRAWN AEROBIC AND  ANAEROBIC Blood Culture adequate volume   Culture   Final    NO GROWTH 1 DAY Performed at [facility], [address] [place], [place] [address]    Report Status PENDING  Incomplete  Blood Culture (routine x 2)     Status: None (Preliminary result)   Collection Time: 10/18/18  9:47 AM  Result Value Ref Range Status   Specimen Description BLOOD SITE NOT SPECIFIED  Final   Special Requests   Final    BOTTLES DRAWN AEROBIC AND ANAEROBIC Blood Culture adequate volume   Culture   Final    NO GROWTH 1 DAY Performed at [facility], [address] [place], [place] [address]    Report Status PENDING  Incomplete   TSH 0.05 free T4 0.98 T3 2.3  Discharge Instructions: Discharge Instructions    Diet -  low sodium heart healthy   Complete by:  As directed    Discharge instructions   Complete by:  As directed    Sra. Baldo Daub,  Fuiste ingresado en el hospital porque tenas fiebre y tu corazn lata rpido. Hicimos muchas pruebas y creo que tienes estreptococos. He enviado un antibitico a tu farmacia. Por favor, recoge esto. Tomar el [employer] veces al da durante los prximos 10 das.   Espero que contine sintindose mejor en casa.  Por favor, haga todo lo posible para beber mucha agua para mantenerse hidratado y tomar Tylenol segn sea necesario para la fiebre, los escalofros, los batidos o Chief Technology Officer.  Si empiezas a Physiological scientist, como dolor de cabeza, nuseas, vmitos, fiebre, por favor regresa al hospital.  Ms. K[place], Devin[place] were admitted to the hospital because you had a fever and your heart was beating fast. We ran lots of tests and I think you have strep throat. I have sent an antibiotic to your pharmacy. Please pick this up. You will take the medicine twice a day for the next 10 days.   I am hopeful that you will continue to feel better at home.  Please do your best to drink plenty of water to stay hydrated and take Tylenol as needed for fever, chills, shakes, or  pain.  If you start feeling worse - such as experiencing headache, nausea, vomiting, fevers please come back to the hospital.   Increase activity slowly   Complete by:  As directed       Signed: Ali Lowe, MD 10/19/2018, 2:40 PM   Pager [phone]

## 2018-10-19 NOTE — Progress Notes (Signed)
Pt showering.  Will reconnect cardiac monitor and IV fluids once pt finishes.

## 2018-10-20 ENCOUNTER — Other Ambulatory Visit: Payer: Self-pay

## 2018-10-20 ENCOUNTER — Observation Stay (HOSPITAL_BASED_OUTPATIENT_CLINIC_OR_DEPARTMENT_OTHER): Payer: Medicaid Other

## 2018-10-20 ENCOUNTER — Observation Stay (HOSPITAL_COMMUNITY)
Admission: EM | Admit: 2018-10-20 | Discharge: 2018-10-21 | Disposition: A | Discharge status: Home / Self Care | Payer: Medicaid Other | Attending: Internal Medicine | Admitting: Internal Medicine

## 2018-10-20 ENCOUNTER — Observation Stay (HOSPITAL_COMMUNITY): Payer: Medicaid Other

## 2018-10-20 ENCOUNTER — Emergency Department (HOSPITAL_COMMUNITY): Payer: Medicaid Other

## 2018-10-20 ENCOUNTER — Encounter (HOSPITAL_COMMUNITY): Payer: Self-pay

## 2018-10-20 DIAGNOSIS — R21 Rash and other nonspecific skin eruption: Secondary | ICD-10-CM | POA: Diagnosis not present

## 2018-10-20 DIAGNOSIS — Z888 Allergy status to other drugs, medicaments and biological substances status: Secondary | ICD-10-CM | POA: Insufficient documentation

## 2018-10-20 DIAGNOSIS — E039 Hypothyroidism, unspecified: Secondary | ICD-10-CM | POA: Insufficient documentation

## 2018-10-20 DIAGNOSIS — Z8349 Family history of other endocrine, nutritional and metabolic diseases: Secondary | ICD-10-CM | POA: Insufficient documentation

## 2018-10-20 DIAGNOSIS — J020 Streptococcal pharyngitis: Secondary | ICD-10-CM | POA: Insufficient documentation

## 2018-10-20 DIAGNOSIS — Z8249 Family history of ischemic heart disease and other diseases of the circulatory system: Secondary | ICD-10-CM | POA: Diagnosis not present

## 2018-10-20 DIAGNOSIS — I514 Myocarditis, unspecified: Secondary | ICD-10-CM | POA: Diagnosis not present

## 2018-10-20 DIAGNOSIS — E890 Postprocedural hypothyroidism: Secondary | ICD-10-CM

## 2018-10-20 DIAGNOSIS — Z8719 Personal history of other diseases of the digestive system: Secondary | ICD-10-CM | POA: Insufficient documentation

## 2018-10-20 DIAGNOSIS — Z79890 Hormone replacement therapy (postmenopausal): Secondary | ICD-10-CM

## 2018-10-20 DIAGNOSIS — N179 Acute kidney failure, unspecified: Secondary | ICD-10-CM | POA: Insufficient documentation

## 2018-10-20 DIAGNOSIS — I361 Nonrheumatic tricuspid (valve) insufficiency: Secondary | ICD-10-CM | POA: Diagnosis not present

## 2018-10-20 DIAGNOSIS — Z87891 Personal history of nicotine dependence: Secondary | ICD-10-CM | POA: Diagnosis not present

## 2018-10-20 DIAGNOSIS — Z791 Long term (current) use of non-steroidal anti-inflammatories (NSAID): Secondary | ICD-10-CM | POA: Insufficient documentation

## 2018-10-20 DIAGNOSIS — R7989 Other specified abnormal findings of blood chemistry: Secondary | ICD-10-CM

## 2018-10-20 DIAGNOSIS — R072 Precordial pain: Secondary | ICD-10-CM

## 2018-10-20 DIAGNOSIS — R079 Chest pain, unspecified: Secondary | ICD-10-CM | POA: Diagnosis not present

## 2018-10-20 DIAGNOSIS — R778 Other specified abnormalities of plasma proteins: Secondary | ICD-10-CM

## 2018-10-20 LAB — I-STAT TROPONIN, ED: Troponin i, poc: 2.47 ng/mL (ref 0.00–0.08)

## 2018-10-20 LAB — CBC
HCT: 35.2 % — ABNORMAL LOW (ref 36.0–46.0)
Hemoglobin: 10.9 g/dL — ABNORMAL LOW (ref 12.0–15.0)
MCH: 24.3 pg — ABNORMAL LOW (ref 26.0–34.0)
MCHC: 31 g/dL (ref 30.0–36.0)
MCV: 78.4 fL — ABNORMAL LOW (ref 80.0–100.0)
Platelets: 156 10*3/uL (ref 150–400)
RBC: 4.49 MIL/uL (ref 3.87–5.11)
RDW: 15 % (ref 11.5–15.5)
WBC: 12.7 10*3/uL — ABNORMAL HIGH (ref 4.0–10.5)
nRBC: 0 % (ref 0.0–0.2)

## 2018-10-20 LAB — BASIC METABOLIC PANEL
Anion gap: 10 (ref 5–15)
BUN: 7 mg/dL (ref 6–20)
CO2: 24 mmol/L (ref 22–32)
CREATININE: 0.7 mg/dL (ref 0.44–1.00)
Calcium: 8.9 mg/dL (ref 8.9–10.3)
Chloride: 104 mmol/L (ref 98–111)
GFR calc Af Amer: >60 mL/min (ref >60)
GFR calc non Af Amer: >60 mL/min (ref >60)
Glucose, Bld: 113 mg/dL — ABNORMAL HIGH (ref 70–99)
Potassium: 3.5 mmol/L (ref 3.5–5.1)
Sodium: 138 mmol/L (ref 135–145)

## 2018-10-20 LAB — TROPONIN I
Troponin I: 1.93 ng/mL (ref <0.03)
Troponin I: 1.16 ng/mL (ref <0.03)
Troponin I: 0.78 ng/mL (ref <0.03)

## 2018-10-20 LAB — HCG, QUANTITATIVE, PREGNANCY: hCG, Beta Chain, Quant, S: 1 m[IU]/mL (ref <5)

## 2018-10-20 LAB — I-STAT BETA HCG BLOOD, ED (MC, WL, AP ONLY): I-stat hCG, quantitative: 27.6 m[IU]/mL — ABNORMAL HIGH (ref <5)

## 2018-10-20 LAB — ECHOCARDIOGRAM COMPLETE

## 2018-10-20 MED ORDER — SODIUM CHLORIDE 0.9 % IV SOLN
Freq: Once | INTRAVENOUS | Status: AC
  Administered 2018-10-20: 05:00:00 via INTRAVENOUS

## 2018-10-20 MED ORDER — AMOXICILLIN 500 MG PO CAPS
500.0000 mg | ORAL_CAPSULE | Freq: Two times a day (BID) | ORAL | Status: DC
  Administered 2018-10-20 – 2018-10-21 (×2): 500 mg via ORAL
  Filled 2018-10-20 (×2): qty 1

## 2018-10-20 MED ORDER — LEVOTHYROXINE SODIUM 25 MCG PO TABS
125.0000 ug | ORAL_TABLET | Freq: Every day | ORAL | Status: DC
  Administered 2018-10-20 – 2018-10-21 (×2): 125 ug via ORAL
  Filled 2018-10-20 (×2): qty 1

## 2018-10-20 MED ORDER — IOPAMIDOL (ISOVUE-370) INJECTION 76%
INTRAVENOUS | Status: AC
  Filled 2018-10-20: qty 100

## 2018-10-20 MED ORDER — IOPAMIDOL (ISOVUE-370) INJECTION 76%
100.0000 mL | Freq: Once | INTRAVENOUS | Status: AC | PRN
  Administered 2018-10-20: 55 mL via INTRAVENOUS

## 2018-10-20 NOTE — Progress Notes (Signed)
  Echocardiogram 2D Echocardiogram has been performed.  Leta JunglingCooper, Alisea Matte M 10/20/2018, 12:09 PM

## 2018-10-20 NOTE — ED Notes (Signed)
Per phlebotomy, will draw labs at 1600

## 2018-10-20 NOTE — ED Triage Notes (Signed)
Pt here with central non radiating chest pain for one day.  No nausea, vomiting or dizziness.  A&Ox4, ambulatory to triage.

## 2018-10-20 NOTE — Progress Notes (Signed)
Admission nurse came to 3e to complete admission hx. Pt requires translator, call placed to [place] by Admission,RN.

## 2018-10-20 NOTE — ED Provider Notes (Signed)
[facility] EMERGENCY DEPARTMENT Provider Note  CSN: [id number] Arrival date & time: 10/20/18 0340  Chief Complaint(s) Chest Pain  HPI Margaret Walsh is a 27 y.o. female with a past medical history of thyroid nodule resulting and thyroidectomy leading to hypothyroidism who presents to the emergency department with chest pain.  The history is provided by the patient.  Chest Pain   This is a recurrent (felt it several days ago when she was admitted for febrile illness) problem. The current episode started 1 to 2 hours ago. The problem occurs constantly. The problem has been resolved. The pain is present in the substernal region. The quality of the pain is described as burning and pressure-like. The pain does not radiate. Duration of episode(s) is 2 hours. The symptoms are aggravated by deep breathing. Associated symptoms include diaphoresis and shortness of breath. Pertinent negatives include no cough, no fever, no headaches, no hemoptysis, no irregular heartbeat, no lower extremity edema, no nausea, no palpitations and no vomiting. Treatments tried: tylenol. The treatment provided moderate relief.  Her past medical history is significant for thyroid problem.  Pertinent negatives for past medical history include no DVT, no hyperhomocysteinemia, no hyperlipidemia, no hypertension, no MI and no PE.   Of note patient was recently admitted for flulike illness 2 days ago and discharged yesterday after a grossly negative work-up, with negative influenza, RVP, strep, EBV, HIV.  During last admission blood cultures were negative.  She was discharged home with antibiotics.  Patient denies any recent travel outside the country.  Past Medical History Past Medical History:  Diagnosis Date  . Pancreatitis   . Thyroid disease    Patient Active Problem List   Diagnosis Date Noted  . Sinus tachycardia 10/18/2018  . Hypothyroidism 10/18/2018  . Acute kidney injury (HCC) 10/18/2018  .  Fever    Home Medication(s) Prior to Admission medications   Medication Sig Start Date End Date Taking? Authorizing Provider  acetaminophen (TYLENOL) 325 MG tablet Take 2 tablets (650 mg total) by mouth every 4 (four) hours as needed. 10/19/18 10/19/19  Ali Lowe, MD  amoxicillin (AMOXIL) 500 MG tablet Take 1 tablet (500 mg total) by mouth 2 (two) times daily for 10 days. 10/19/18 10/29/18  Ali Lowe, MD  benzonatate (TESSALON) 100 MG capsule Take 1 capsule (100 mg total) by mouth every 8 (eight) hours. 10/18/18   Garlon Hatchet, PA-C  ibuprofen (ADVIL,MOTRIN) 800 MG tablet Take 1 tablet (800 mg total) by mouth 3 (three) times daily. 10/18/18   Garlon Hatchet, PA-C  levothyroxine (SYNTHROID, LEVOTHROID) 125 MCG tablet Take 125 mcg by mouth daily before breakfast.    [provider]  ondansetron (ZOFRAN ODT) 4 MG disintegrating tablet Take 1 tablet (4 mg total) by mouth every 8 (eight) hours as needed for nausea. 10/19/18   Ali Lowe, MD  Past Surgical History Past Surgical History:  Procedure Laterality Date  . THYROIDECTOMY     Family History History reviewed. No pertinent family history.  Social History Social History   Tobacco Use  . Smoking status: Former Smoker    Last attempt to quit: 07/31/2018    Years since quitting: 0.2  . Smokeless tobacco: Never Used  Substance Use Topics  . Alcohol use: No  . Drug use: Yes    Types: Marijuana   Allergies Thyroid hormones and Methimazole  Review of Systems Review of Systems  Constitutional: Positive for diaphoresis. Negative for fever.  Respiratory: Positive for shortness of breath. Negative for cough and hemoptysis.   Cardiovascular: Positive for chest pain. Negative for palpitations.  Gastrointestinal: Negative for nausea and vomiting.  Neurological: Negative for headaches.   All  other systems are reviewed and are negative for acute change except as noted in the HPI  Physical Exam Vital Signs  I have reviewed the triage vital signs BP 130/81   Pulse 93   Temp 98.2 F (36.8 C) (Oral)   Resp 19   LMP 10/04/2018 Comment: pt shielded  SpO2 99%   Physical Exam  Constitutional: She is oriented to person, place, and time. She appears well-developed and well-nourished. No distress.  HENT:  Head: Normocephalic and atraumatic.  Nose: Nose normal.  Eyes: Pupils are equal, round, and reactive to light. Conjunctivae and EOM are normal. Right eye exhibits no discharge. Left eye exhibits no discharge. No scleral icterus.  Neck: Normal range of motion. Neck supple.  Cardiovascular: Normal rate and regular rhythm. Exam reveals no gallop and no friction rub.  No murmur heard. Pulmonary/Chest: Effort normal and breath sounds normal. No stridor. No respiratory distress. She has no rales.  Abdominal: Soft. She exhibits no distension. There is no tenderness.  Musculoskeletal: She exhibits no edema or tenderness.  No edema  Neurological: She is alert and oriented to person, place, and time.  Skin: Skin is warm and dry. No rash noted. She is not diaphoretic. No erythema.  Psychiatric: She has a normal mood and affect.  Vitals reviewed.   ED Results and Treatments Labs (all labs ordered are listed, but only abnormal results are displayed) Labs Reviewed  BASIC METABOLIC PANEL - Abnormal; Notable for the following components:      Result Value   Glucose, Bld 113 (*)    All other components within normal limits  CBC - Abnormal; Notable for the following components:   WBC 12.7 (*)    Hemoglobin 10.9 (*)    HCT 35.2 (*)    MCV 78.4 (*)    MCH 24.3 (*)    All other components within normal limits  I-STAT TROPONIN, ED - Abnormal; Notable for the following components:   Troponin i, poc 2.47 (*)    All other components within normal limits  I-STAT BETA HCG BLOOD, ED ([facility], [facility],  [facility] ONLY) - Abnormal; Notable for the following components:   I-stat hCG, quantitative 27.6 (*)    All other components within normal limits  CULTURE, BLOOD (ROUTINE X 2)  CULTURE, BLOOD (ROUTINE X 2)  HCG, QUANTITATIVE, PREGNANCY  EKG  EKG Interpretation  Date/Time:  Friday October 20 2018 03:46:36 EST Ventricular Rate:  89 PR Interval:  162 QRS Duration: 78 QT Interval:  378 QTC Calculation: 459 R Axis:   71 Text Interpretation:  Normal sinus rhythm Low voltage QRS Borderline ECG rate improved Otherwise no significant change Confirmed by Drema Pry [phone]) on 10/20/2018 4:32:22 AM Also confirmed by Drema Pry [phone]), editor Barbette Hair [phone])  on 10/20/2018 7:03:15 AM      Radiology Dg Chest 2 View  Result Date: 10/20/2018 CLINICAL DATA:  27 year old female with chest pain. EXAM: CHEST - 2 VIEW COMPARISON:  Chest radiograph dated 10/18/2018 FINDINGS: The heart size and mediastinal contours are within normal limits. Both lungs are clear. The visualized skeletal structures are unremarkable. IMPRESSION: No active cardiopulmonary disease. Electronically Signed   By: Elgie Collard M.D.   On: 10/20/2018 05:14   Pertinent labs & imaging results that were available during my care of the patient were reviewed by me and considered in my medical decision making (see chart for details).  Medications Ordered in ED Medications  iopamidol (ISOVUE-370) 76 % injection (has no administration in time range)  0.9 %  sodium chloride infusion ( Intravenous New Bag/Given 10/20/18 0456)                                                                                                                                    Procedures .Critical Care Performed by: Nira Conn, MD Authorized by: Nira Conn, MD     CRITICAL CARE Performed by: Amadeo Garnet     Total critical care time: 35 minutes  Critical care time was exclusive of separately billable procedures and treating other patients.  Critical care was necessary to treat or prevent imminent or life-threatening deterioration.  Critical care was time spent personally by me on the following activities: development of treatment plan with patient and/or surrogate as well as nursing, discussions with consultants, evaluation of patient's response to treatment, examination of patient, obtaining history from patient or surrogate, ordering and performing treatments and interventions, ordering and review of laboratory studies, ordering and review of radiographic studies, pulse oximetry and re-evaluation of patient's condition.  (including critical care time)  Medical Decision Making / ED Course I have reviewed the nursing notes for this encounter and the patient's prior records (if available in EHR or on provided paperwork).    Patient is currently afebrile with stable vital signs, well-hydrated, nontoxic.  Substernal chest pain which has now subsided.  EKG without acute ischemic changes or evidence of pericarditis.  Initial troponin elevated at 2.4.   Chest x-ray without evidence of pneumonia, pulmonary edema, or pleural effusions.  CBC is downtrending.  Hemoglobin stable.  BMP without significant electrolyte derangements or renal insufficiency.  Elevated troponin etiology uncertain at this time.  Given recent flulike illness, there is a concern for possible myocarditis.  Highly doubt ACS in this patient.  Other possibilities would be pulmonary embolism.  Will obtain a CT scan to assess for PE or any occult infectious etiology in the thorax that would explain her symptoms.  At this time we are holding off on anticoagulation until CT scan returns.  If it is notable for PE, she will require anticoagulation.  If it is negative will defer to internal medicine.  I discussed the case with  internal medicine who will admit the patient for continued work-up and management.  Final Clinical Impression(s) / ED Diagnoses Final diagnoses:  Substernal chest pain  Elevated troponin      This chart was dictated using voice recognition software.  Despite best efforts to proofread,  errors can occur which can change the documentation meaning.   Nira Conn, MD 10/20/18 [phone]

## 2018-10-20 NOTE — ED Notes (Signed)
Pt transported to echo 

## 2018-10-20 NOTE — H&P (Addendum)
Date: 10/20/2018               Patient Name:  Margaret Walsh MRN: [id number]  DOB: May 24, 1991 Age / Sex: 27 y.o., female   PCP: [facility]         Medical Service: Internal Medicine Teaching Service         Attending Physician: Dr. Inez Catalina, MD    First Contact: Dr. Petra Kuba  Pager: [phone]  Second Contact: Dr. Maryln Manuel Pager: [phone]       After Hours (After 5p/  First Contact Pager: [phone]  weekends / holidays): Second Contact Pager: [phone]   Chief Complaint: chest pain   History of Present Illness:  27 year old female with Hypothyroidism post thyroid ablation and remote history of pancreatitis presents after an episode of chest pain. She was admit earlier this weak for sore throat with tonsillar exudate, sinus pressure, fevers, tachycardia, and leukocytosis which were thought secondary to viral vs strep pharyngitis. She was discharged yesterday with plans to complete a course of amoxicillin. She had some residual sore throat and fatigue but no further rigors or palpitations. This morning at 2am she awoke with a burning chest pain, she took tylenol then presented to the emergency department and the pain relieved when she arrived. She denies abdominal pain, reduced appetite,nausea, vomiting, diarrhea, dyspnea, tachypnea, presyncope, myalgias.   Meds:  No outpatient medications have been marked as taking for the 10/20/18 encounter [facility] Encounter).     Allergies: Allergies as of 10/20/2018 - Review Complete 10/20/2018  Allergen Reaction Noted  . Thyroid hormones  04/18/2018  . Methimazole Rash 01/03/2018   Past Medical History:  Diagnosis Date  . Pancreatitis   . Thyroid disease     Family History: Mother - rheumatoid arthritis, hyperthyroidism, "tachycardia"  grandmother had diabetes grandfather had prostate cancer mother, father, aunt, and grandmother had hypertension  Social History:  denies alcohol or tobacco use. Smokes  marijuana on occasion, denies illicit drug use.   Review of Systems: A complete ROS was negative except as per HPI.   Physical Exam: Blood pressure (!) 123/91, pulse (!) 103, temperature 98.3 F (36.8 C), temperature source Oral, resp. rate 18, last menstrual period 10/04/2018, SpO2 100 %. General: well appearing, no acute distress  HEENT: tonsilar erythema without exudate  Cardiac: regular rate and rhythm, no murmurs, no JVD, no rubs or gallops, no S3/S4, no peripheral edema  Pulm: lungs are clear to auscultation  GI: abdomen is soft, non tender, non distended, no organomegaly  Extremities: well perfused, no edema  Skin: no rashes evident over the face, chest, arms, or lower legs Neuro: alert, oriented, conversational   EKG: personally reviewed my interpretation is sinus rhythm, normal voltage, no PR depression in AVR or ST segment changes   CXR: personally reviewed my interpretation is good inspiratory effort, no consolidation or effusion   Assessment & Plan by Problem: Active Problems:   Myocarditis (HCC) 27 year old female with Hypothyroidism post thyroid ablation and remote history of pancreatitis presents after an episode of chest pain proceeded by viral respiratory illness. Chest pain characteristics meets criteria for clinically suspected myocarditis: acute chest pain associated with elevated troponin and echo abnormalities. The lack of heart failure or symptoms of fulminant myocarditis  - trend troponins  - telemetry monitoring  - avoid anticoagulation and NSAIDs  - continue course of amoxicillin for possible strep 500 mg BID, stop date 12/14 - avoid exercise  - follow  up blood cultures from today and 12/4 which remain no growth at two days   Hypothyroidism  - continue synthroid   Dispo: Admit patient to Observation with expected length of stay less than 2 midnights.   Signed: Eulah PontBlum, Jeanann Balinski, MD 10/20/2018, 4:13 PM  Pager: [phone]

## 2018-10-21 DIAGNOSIS — Z8719 Personal history of other diseases of the digestive system: Secondary | ICD-10-CM | POA: Diagnosis not present

## 2018-10-21 DIAGNOSIS — E039 Hypothyroidism, unspecified: Secondary | ICD-10-CM

## 2018-10-21 DIAGNOSIS — I400 Infective myocarditis: Secondary | ICD-10-CM

## 2018-10-21 DIAGNOSIS — J020 Streptococcal pharyngitis: Secondary | ICD-10-CM | POA: Diagnosis not present

## 2018-10-21 LAB — CBC WITH DIFFERENTIAL/PLATELET
Abs Immature Granulocytes: 0.01 10*3/uL (ref 0.00–0.07)
BASOS PCT: 0 %
Basophils Absolute: 0 10*3/uL (ref 0.0–0.1)
EOS ABS: 0.1 10*3/uL (ref 0.0–0.5)
Eosinophils Relative: 1 %
HCT: 35.1 % — ABNORMAL LOW (ref 36.0–46.0)
Hemoglobin: 10.9 g/dL — ABNORMAL LOW (ref 12.0–15.0)
Immature Granulocytes: 0 %
Lymphocytes Relative: 39 %
Lymphs Abs: 2.3 10*3/uL (ref 0.7–4.0)
MCH: 24.2 pg — ABNORMAL LOW (ref 26.0–34.0)
MCHC: 31.1 g/dL (ref 30.0–36.0)
MCV: 78 fL — ABNORMAL LOW (ref 80.0–100.0)
Monocytes Absolute: 0.4 10*3/uL (ref 0.1–1.0)
Monocytes Relative: 6 %
Neutro Abs: 3.1 10*3/uL (ref 1.7–7.7)
Neutrophils Relative %: 54 %
Platelets: 185 10*3/uL (ref 150–400)
RBC: 4.5 MIL/uL (ref 3.87–5.11)
RDW: 14.9 % (ref 11.5–15.5)
WBC: 5.8 10*3/uL (ref 4.0–10.5)
nRBC: 0 % (ref 0.0–0.2)

## 2018-10-21 LAB — SEDIMENTATION RATE: Sed Rate: 89 mm/hr — ABNORMAL HIGH (ref 0–22)

## 2018-10-21 LAB — C-REACTIVE PROTEIN: CRP: 8.5 mg/dL — ABNORMAL HIGH (ref <1.0)

## 2018-10-21 LAB — HIV-1 RNA QUANT-NO REFLEX-BLD
HIV 1 RNA Quant: <20 copies/mL
LOG10 HIV-1 RNA: UNDETERMINED log10copy/mL

## 2018-10-21 MED ORDER — AMOXICILLIN 500 MG PO CAPS: 500.0000 mg | ORAL_CAPSULE | Freq: Two times a day (BID) | ORAL | 0 refills | Status: AC

## 2018-10-21 NOTE — Plan of Care (Signed)
  Problem: Education: Goal: Knowledge of General Education information will improve Description Including pain rating scale, medication(s)/side effects and non-pharmacologic comfort measures Outcome: Progressing   Problem: Clinical Measurements: Goal: Ability to maintain clinical measurements within normal limits will improve Outcome: Progressing   Problem: Nutrition: Goal: Adequate nutrition will be maintained Outcome: Progressing   Problem: Safety: Goal: Ability to remain free from injury will improve Outcome: Progressing   Problem: Safety: Goal: Ability to remain free from injury will improve Outcome: Progressing

## 2018-10-21 NOTE — Progress Notes (Signed)
DC education provided in spanish version, IV out, Tele DC, pt showed understanding towards the medicine timing. Pt left the unit in ambulation accompanied with her husband  Margaret FarberRekha, RCharity fundraiser

## 2018-10-21 NOTE — Progress Notes (Signed)
Handoff taken from Nurse K, pt is resting comfortably in bed baby is in bed side, she was concerned about her little rash in her cheek, lotion provided for  dry skin, will continue to monitor the patient.  Lonia Farberekha, RN

## 2018-10-21 NOTE — Progress Notes (Signed)
   Subjective:   Margaret Walsh was seen resting in her bed this morning with her significant other and baby by side. She states that she has decreased chest discomfort, but has noticed some tingling sensation in her forehead and also a mild rash on right side of mouth.  Objective:  Vital signs in last 24 hours: Vitals:   10/20/18 1613 10/20/18 1928 10/21/18 0013 10/21/18 0507  BP: (!) 123/91 114/86 102/66 97/82  Pulse: (!) 103 (!) 107 88 96  Resp: '18 20 18 14  '$ Temp: 98.3 F (36.8 C) 98.1 F (36.7 C) 98.7 F (37.1 C) 98.4 F (36.9 C)  TempSrc: Oral Oral Oral Oral  SpO2: 100% 100% 100% 100%  Weight: 81.1 kg   80.3 kg  Height: '5\' 3"'$  (1.6 m)       Physical Exam  Constitutional: Appears well-developed and well-nourished. No distress.  HENT:  Head: Normocephalic and atraumatic.  Eyes: Conjunctivae are normal.  Cardiovascular: Normal rate, regular rhythm and normal heart sounds. No chest wall pain. Respiratory: Effort normal and breath sounds normal. No respiratory distress. No wheezes.  GI: Soft. Bowel sounds are normal. No distension. There is no tenderness.  Musculoskeletal: No edema.  Neurological: Is alert.  Skin: Not diaphoretic. No erythema. Right corner of mouth with mild erythema and 2-9m in size vesicles. Psychiatric: Normal mood and affect. Behavior is normal. Judgment and thought content normal.    Assessment/Plan:  Ms. RYungis a 27y.o f with hypothyroidism who was readmitted to hospital 10/20/18 with chest pain relieved with tylenol. EKG without ischemic changes, troponin negative. CTA chest ordered in ED did not show pe or acute cardiopulmonary abnormalities.   Viral Myocarditis Patient's troponin has trended down from 1.93>1.16>0.78. Afebrile since admission, no leukocytosis. Blood culture 12/6 no growth <12hrs. Crp=8.5, esr=89. Echo show lvef 55%, No regional wall motion abnormalities, no pfo, mild mitral regurgitation. Patient is euvolemic on exam and has not had  any arrhythmias.  RVP done during previous hospitalization on 10/18/18 was negative for virus. However, coxsakie virus is not on that panel which is an organism that has been linked with myocarditis.   -Avoid NSAIDS -Recommend rest and plenty of fluids -supportive care -avoid exercise and heavy alcohol consumption  Strep Pharyngitis Exudate was visualized on uvula and posterior pharynx during previous hospitalization. She will be continued on amoxicillin for a total 5 day duration.   -Amoxicillin '500mg'$  bid Day 3  Hypothyroidism  -continue levothyroixine 1225m qd  Dispo: Anticipated discharge today 12/7 if stable.  ChLars MageMD 10/21/2018, 11:09 AM Pager: [phone]

## 2018-10-22 NOTE — Discharge Summary (Signed)
Name: Margaret Walsh MRN: [id number] DOB: 18-Dec-1990 27 y.o. PCP: [facility]  Date of Admission: 10/20/2018  3:41 AM Date of Discharge: 10/21/2018 Attending Physician: Dr. Gilles Chiquito  Discharge Diagnosis: 1. Viral Myocarditis  Discharge Medications: Allergies as of 10/21/2018      Reactions   Thyroid Hormones    Allergic to unknown thyroid med   Methimazole Rash      Medication List    STOP taking these medications   amoxicillin 500 MG tablet Commonly known as:  AMOXIL Replaced by:  amoxicillin 500 MG capsule   benzonatate 100 MG capsule Commonly known as:  TESSALON   ibuprofen 800 MG tablet Commonly known as:  ADVIL,MOTRIN   ondansetron 4 MG disintegrating tablet Commonly known as:  ZOFRAN-ODT     TAKE these medications   acetaminophen 325 MG tablet Commonly known as:  TYLENOL Take 2 tablets (650 mg total) by mouth every 4 (four) hours as needed.   amoxicillin 500 MG capsule Commonly known as:  AMOXIL Take 1 capsule (500 mg total) by mouth every 12 (twelve) hours for 7 days. Replaces:  amoxicillin 500 MG tablet   levothyroxine 125 MCG tablet Commonly known as:  SYNTHROID, LEVOTHROID Take 125 mcg by mouth daily before breakfast.       Disposition and follow-up:   [name] was discharged from [facility] in Good condition.  At the hospital follow up visit please address:  1.  Viral Myocarditis: Patient is to rest, avoid nsaids and exercise and monitor for heart failure signs and symptoms.   2.  Labs / imaging needed at time of follow-up: none  3.  Pending labs/ test needing follow-up: none  Follow-up Appointments: [place] Follow up in 1 week(s).   Specialty:  Pediatrics Contact information: [place] [address] [phone]           Hospital Course by problem list:  Myocarditis [name] presented with chest  pain that started morning of 12/6. She was readmitted one day after a hospitalization (12/4-12/5) for a febrile illness that was thought to be due to a strep pharyngitis infection and she was discharged with a 10 day course of amoxicillin.   EKG without ischemic changes, troponin elevation to 1.93>1.16>0.78. Echo with lv ef 55%, inferior basal hypokinesis of left ventricle, no regional wall motion abnormalities. ESR and CRP elevation. Patient thought to have myocarditis. Counseled patient on avoiding NSAIDS and exercise. She was also educated on heart failure signs and symptoms and told to monitor for it over the next few days. She was also told to finish her amoxicillin regimen.   Discharge Vitals:   BP 116/85 (BP Location: Left Arm)   Pulse 94   Temp 98.2 F (36.8 C) (Oral)   Resp 18   Ht 5' 3"  (1.6 m)   Wt 80.3 kg Comment: scale C  LMP 10/04/2018 Comment: pt shielded  SpO2 100%   BMI 31.37 kg/m   Pertinent Labs, Studies, and Procedures:   Troponin (Point of Care Test) Recent Labs    10/20/18 0407  TROPIPOC 2.47*   Lactic Acid, Venous    Component Value Date/Time   LATICACIDVEN 0.83 10/18/2018 0710   ESR=89 CRP=8.5 RVP: negative for all  Influenza panel (10/18/18): negative for both infl a and b   Discharge Instructions: Discharge Instructions    Call MD for:  difficulty breathing, headache or visual disturbances  Complete by:  As directed    Call MD for:  persistant dizziness or light-headedness   Complete by:  As directed    Call MD for:  persistant nausea and vomiting   Complete by:  As directed    Call MD for:  temperature >100.4   Complete by:  As directed    Diet - low sodium heart healthy   Complete by:  As directed    Discharge instructions   Complete by:  As directed    It was a pleasure to take care of you [name]. During this hospitalization you were taken care of for viral myocarditis and for strep pharyngitis. Please make sure to complete your  amoxicillin regimen. Please return if you notice worsening chest pain, difficulty breathing, weight gain.   Increase activity slowly   Complete by:  As directed       Signed: Lars Mage, MD 10/22/2018, 9:25 PM   Pager: [phone]

## 2018-10-23 LAB — CULTURE, BLOOD (ROUTINE X 2)
Culture: NO GROWTH
Culture: NO GROWTH
Special Requests: ADEQUATE
Special Requests: ADEQUATE

## 2018-10-25 LAB — CULTURE, BLOOD (ROUTINE X 2)
Culture: NO GROWTH
Culture: NO GROWTH

## 2018-12-29 ENCOUNTER — Ambulatory Visit: Payer: Medicaid Other | Admitting: Nurse Practitioner

## 2019-06-22 ENCOUNTER — Ambulatory Visit (HOSPITAL_COMMUNITY)
Admission: EM | Admit: 2019-06-22 | Discharge: 2019-06-22 | Disposition: A | Discharge status: Home / Self Care | Payer: Medicaid Other | Attending: Family Medicine | Admitting: Family Medicine

## 2019-06-22 ENCOUNTER — Encounter (HOSPITAL_COMMUNITY): Payer: Self-pay

## 2019-06-22 ENCOUNTER — Other Ambulatory Visit: Payer: Self-pay

## 2019-06-22 DIAGNOSIS — R51 Headache: Secondary | ICD-10-CM | POA: Diagnosis not present

## 2019-06-22 DIAGNOSIS — E119 Type 2 diabetes mellitus without complications: Secondary | ICD-10-CM | POA: Diagnosis not present

## 2019-06-22 DIAGNOSIS — R519 Headache, unspecified: Secondary | ICD-10-CM

## 2019-06-22 HISTORY — DX: Type 2 diabetes mellitus without complications: E11.9

## 2019-06-22 LAB — GLUCOSE, CAPILLARY: Glucose-Capillary: 102 mg/dL — ABNORMAL HIGH (ref 70–99)

## 2019-06-22 MED ORDER — TETRACAINE HCL 0.5 % OP SOLN
OPHTHALMIC | Status: AC
  Filled 2019-06-22: qty 4

## 2019-06-22 MED ORDER — KETOROLAC TROMETHAMINE 60 MG/2ML IM SOLN
60.0000 mg | Freq: Once | INTRAMUSCULAR | Status: AC
  Administered 2019-06-22: 60 mg via INTRAMUSCULAR

## 2019-06-22 MED ORDER — IBUPROFEN 800 MG PO TABS: 800.0000 mg | ORAL_TABLET | Freq: Three times a day (TID) | ORAL | 0 refills | Status: DC | PRN

## 2019-06-22 MED ORDER — KETOROLAC TROMETHAMINE 60 MG/2ML IM SOLN
INTRAMUSCULAR | Status: AC
  Filled 2019-06-22: qty 2

## 2019-06-22 MED ORDER — FLUTICASONE PROPIONATE 50 MCG/ACT NA SUSP: 1.0000 | Freq: Every day | NASAL | 2 refills | Status: DC

## 2019-06-22 NOTE — ED Triage Notes (Signed)
Patient presents to Urgent Care with complaints of left sided facial droop. Patient reports she is a newly diagnosed diabetic and is unsure if that is related to her feeling of facial droop. Pt has no neuro deficits upon assessment, is alert and oriented x4.

## 2019-06-22 NOTE — Discharge Instructions (Signed)
Ibuprofen as needed for pain, don't take next dose for another 8 hours.  Daily flonase may help as well.  If any worsening of vision, headache, dizziness please go to the er.  If continue to have intermittent or persistent symptoms please follow up with your primary care provider.   Ibuprofeno segn sea necesario para el dolor, no tome la siguiente dosis durante otras 8 horas.  La flonase diaria tambin puede ayudar.  Si algn empeoramiento de la visin, dolor de cabeza, mareos por favor vaya a la er.  Si contina teniendo sntomas intermitentes o persistentes, por favor haga un seguimiento con su proveedor de atencin primaria.

## 2019-06-23 NOTE — ED Provider Notes (Signed)
[facility]    CSN: [id number] Arrival date & time: 06/22/19  1827      History   Chief Complaint Chief Complaint  Patient presents with  . Facial Droop    (per pt)    HPI Margaret Walsh is a 28 y.o. female.   Jamilyn Baldo Walsh presents with complaints of sensation of pressure to left eye which has progressed to pressure to left temple and face. Comes and goes, woke with it today at 10a. Denies any numbness tingling or weakness. No vision change. Denies any previous similar. No speech difficulty. No lip involvement, difficult with swallowing. She does have light sensitivity to left eye. No redness to eye. No drainage. No URI symptoms. No ear pain, dental pain or jaw pain. She was recently started on medication for type 2 diabetes, has had loose stool related to this. No history of migraines. No dizziness. No neck pain. No fevers. History of DM, pancreatitis, hypothyroid.    Spanish video interpreter used to collect history and physical exam.    ROS per HPI, negative if not otherwise mentioned.      Past Medical History:  Diagnosis Date  . Diabetes mellitus without complication (HCC)   . Pancreatitis   . Thyroid disease     Patient Active Problem List   Diagnosis Date Noted  . 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 Sinus tachycardia 10/18/2018  . Hypothyroidism 10/18/2018  . Acute kidney injury (HCC) 10/18/2018  . Fever     Past Surgical History:  Procedure Laterality Date  . THYROIDECTOMY      OB History   No obstetric history on file.      Home Medications    Prior to Admission medications   Medication Sig Start Date End Date Taking? Authorizing Provider  metFORMIN (GLUCOPHAGE) 500 MG tablet TAKE 1 TABLET BY MOUTH ONCE DAILY WITH BREAKFAST 06/18/19  Yes [provider]  acetaminophen (TYLENOL) 325 MG tablet Take 2 tablets (650 mg total) by mouth every 4 (four) hours as needed. 10/19/18 10/19/19  Ali LoweVogel, Marie S, MD  fluticasone (FLONASE) 50 MCG/ACT  nasal spray Place 1 spray into both nostrils daily. 06/22/19   Georgetta HaberBurky, Lutie Pickler B, NP  ibuprofen (ADVIL) 800 MG tablet Take 1 tablet (800 mg total) by mouth every 8 (eight) hours as needed. 06/22/19   Georgetta HaberBurky, Taiana Temkin B, NP  levothyroxine (SYNTHROID, LEVOTHROID) 125 MCG tablet Take 125 mcg by mouth daily before breakfast.    [provider]    Family History Family History  Problem Relation Age of Onset  . Healthy Mother     Social History Social History   Tobacco Use  . Smoking status: Former Smoker    Quit date: 07/31/2018    Years since quitting: 0.8  . Smokeless tobacco: Never Used  Substance Use Topics  . Alcohol use: No  . Drug use: Yes    Types: Marijuana     Allergies   Thyroid hormones and Methimazole   Review of Systems Review of Systems   Physical Exam Triage Vital Signs ED Triage Vitals  Enc Vitals Group     BP 06/22/19 1844 125/89     Pulse Rate 06/22/19 1844 89     Resp 06/22/19 1844 16     Temp 06/22/19 1844 98.3 F (36.8 C)     Temp Source 06/22/19 1844 Oral     SpO2 06/22/19 1844 100 %     Weight --      Height --  Head Circumference --      Peak Flow --      Pain Score 06/22/19 1843 2     Pain Loc --      Pain Edu? --      Excl. in [place]? --    No data found.  Updated Vital Signs BP 125/89 (BP Location: Left Arm)   Pulse 89   Temp 98.3 F (36.8 C) (Oral)   Resp 16   SpO2 100%   Visual Acuity Right Eye Distance: 20/13 Left Eye Distance: 20/15 Bilateral Distance: 20/15  Right Eye Near:   Left Eye Near:    Bilateral Near:     Physical Exam Constitutional:      General: She is not in acute distress.    Appearance: She is well-developed.  HENT:     Head:     Jaw: No trismus.     Comments: No temporal tenderness or palpable abnormality Eyes:     General: Lids are normal. Vision grossly intact. Gaze aligned appropriately. No visual field deficit.       Right eye: No discharge.        Left eye: No discharge.      Intraocular pressure: Right eye pressure is 19 mmHg. Left eye pressure is 19 mmHg. Measurements were taken using a handheld tonometer.    Extraocular Movements: Extraocular movements intact.     Conjunctiva/sclera: Conjunctivae normal.     Right eye: Right conjunctiva is not injected.     Left eye: Left conjunctiva is not injected.     Pupils: Pupils are equal, round, and reactive to light.  Cardiovascular:     Rate and Rhythm: Normal rate and regular rhythm.     Heart sounds: Normal heart sounds.  Pulmonary:     Effort: Pulmonary effort is normal.     Breath sounds: Normal breath sounds.  Skin:    General: Skin is warm and dry.  Neurological:     General: No focal deficit present.     Mental Status: She is alert and oriented to person, place, and time.     Cranial Nerves: No cranial nerve deficit or facial asymmetry.     Sensory: Sensation is intact.     Motor: Motor function is intact.      UC Treatments / Results  Labs (all labs ordered are listed, but only abnormal results are displayed) Labs Reviewed  GLUCOSE, CAPILLARY - Abnormal; Notable for the following components:      Result Value   Glucose-Capillary 102 (*)    All other components within normal limits  CBG MONITORING, ED    EKG   Radiology No results found.  Procedures Procedures (including critical care time)  Medications Ordered in UC Medications  ketorolac (TORADOL) injection 60 mg (60 mg Intramuscular Given 06/22/19 1942)  tetracaine (PONTOCAINE) 0.5 % ophthalmic solution (has no administration in time range)  ketorolac (TORADOL) 60 MG/2ML injection (has no administration in time range)    Initial Impression / Assessment and Plan / UC Course  I have reviewed the triage vital signs and the nursing notes.  Pertinent labs & imaging results that were available during my care of the patient were reviewed by me and considered in my medical decision making (see chart for details).     No red flag  findings on exam. Eye pressures by tono pen equal bilaterally, vision intact; no cranial nerve abnormalities noted. Face is symmetrical. No obvious temporal arteritis. Will treat as cluster headache at this  time. Strict er precautions provided. Patient verbalized understanding and agreeable to plan.   Final Clinical Impressions(s) / UC Diagnoses   Final diagnoses:  Left facial pressure and pain     Discharge Instructions     Ibuprofen as needed for pain, don't take next dose for another 8 hours.  Daily flonase may help as well.  If any worsening of vision, headache, dizziness please go to the er.  If continue to have intermittent or persistent symptoms please follow up with your primary care provider.   Ibuprofeno segn sea necesario para el dolor, no tome la siguiente dosis durante otras 8 horas.  La flonase diaria tambin puede ayudar.  Si algn empeoramiento de la visin, dolor de cabeza, mareos por favor vaya a la er.  Si contina teniendo sntomas intermitentes o persistentes, por favor haga un seguimiento con su proveedor de atencin primaria.    ED Prescriptions    Medication Sig Dispense Auth. Provider   fluticasone (FLONASE) 50 MCG/ACT nasal spray Place 1 spray into both nostrils daily. 16 g Augusto Gamble B, NP   ibuprofen (ADVIL) 800 MG tablet Take 1 tablet (800 mg total) by mouth every 8 (eight) hours as needed. 21 tablet Zigmund Gottron, NP     Controlled Substance Prescriptions [place] Controlled Substance Registry consulted? Not Applicable   Zigmund Gottron, NP 06/23/19 1014

## 2019-10-27 ENCOUNTER — Ambulatory Visit (HOSPITAL_COMMUNITY)
Admission: EM | Admit: 2019-10-27 | Discharge: 2019-10-27 | Disposition: A | Discharge status: Home / Self Care | Payer: Medicaid Other

## 2019-10-27 ENCOUNTER — Other Ambulatory Visit: Payer: Self-pay

## 2019-10-27 ENCOUNTER — Encounter (HOSPITAL_COMMUNITY): Payer: Self-pay | Admitting: *Deleted

## 2019-10-27 DIAGNOSIS — K0889 Other specified disorders of teeth and supporting structures: Secondary | ICD-10-CM

## 2019-10-27 HISTORY — DX: COVID-19: U07.1

## 2019-10-27 MED ORDER — IBUPROFEN 800 MG PO TABS: 800.0000 mg | ORAL_TABLET | Freq: Three times a day (TID) | ORAL | 0 refills | Status: DC

## 2019-10-27 MED ORDER — AMOXICILLIN 500 MG PO CAPS: 500.0000 mg | ORAL_CAPSULE | Freq: Three times a day (TID) | ORAL | 0 refills | Status: AC

## 2019-10-27 NOTE — ED Triage Notes (Addendum)
Pt states she was tested for Covid 12/8 - received positive test.  Pt states started with loss of taste 3-4 days ago, along with right dental pain x 4 days.  Denies any other sxs.

## 2019-10-27 NOTE — Discharge Instructions (Signed)
Begin amoxicillin to treat infection causing pain  For pain please take 600mg -800mg  of Ibuprofen every 8 hours, take with 1000 mg of Tylenol Extra strength every 8 hours. These are safe to take together. Please take with food.   Please return if you start to experience significant swelling of your face, experiencing fever.

## 2019-10-28 NOTE — ED Provider Notes (Signed)
[facility]    CSN: [id number] Arrival date & time: 10/27/19  1512      History   Chief Complaint Chief Complaint  Patient presents with  . Dental Pain  . COVID +    HPI Margaret Walsh is a 28 y.o. female history of DM type II, presenting today for evaluation of dental pain.  Patient recently tested positive for Covid on 12/8.  She denies any significant symptoms, denying cough, congestion or sore throat.  Denies fever chills or body aches.  Her main symptom has been loss of taste as well as has had an exposure.  Over the past few days she has developed increased dental pain to her right lower jaw.  Feels as if her wisdom tooth is coming in and area feels very inflamed.  She denies significant swelling.  She denies any fevers.  Denies difficulty swallowing.  Denies neck stiffness.  She has taken some Tylenol which does provide temporary relief.  HPI  Past Medical History:  Diagnosis Date  . COVID-19 virus detected   . Diabetes mellitus without complication (HCC)   . Pancreatitis   . Thyroid disease     Patient Active Problem List   Diagnosis Date Noted  . 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 Sinus tachycardia 10/18/2018  . Hypothyroidism 10/18/2018  . Acute kidney injury (HCC) 10/18/2018  . Fever     Past Surgical History:  Procedure Laterality Date  . HERNIA REPAIR    . THYROIDECTOMY      OB History   No obstetric history on file.      Home Medications    Prior to Admission medications   Medication Sig Start Date End Date Taking? Authorizing Provider  levothyroxine (SYNTHROID, LEVOTHROID) 125 MCG tablet Take 125 mcg by mouth daily before breakfast.   Yes [provider]  metFORMIN (GLUCOPHAGE) 500 MG tablet TAKE 1 TABLET BY MOUTH ONCE DAILY WITH BREAKFAST 06/18/19  Yes [provider]  UNKNOWN TO PATIENT BCPs   Yes [provider]  amoxicillin (AMOXIL) 500 MG capsule Take 1 capsule (500 mg total) by mouth 3 (three) times daily  for 7 days. 10/27/19 11/03/19  Klark Vanderhoef C, PA-C  fluticasone (FLONASE) 50 MCG/ACT nasal spray Place 1 spray into both nostrils daily. 06/22/19   Georgetta HaberBurky, Natalie B, NP  ibuprofen (ADVIL) 800 MG tablet Take 1 tablet (800 mg total) by mouth 3 (three) times daily. 10/27/19   Rozena Fierro, Junius CreamerHallie C, PA-C    Family History Family History  Problem Relation Age of Onset  . Healthy Mother     Social History Social History   Tobacco Use  . Smoking status: Former Smoker    Quit date: 07/31/2018    Years since quitting: 1.2  . Smokeless tobacco: Never Used  Substance Use Topics  . Alcohol use: No  . Drug use: Not Currently    Types: Marijuana     Allergies   Thyroid hormones and Methimazole   Review of Systems Review of Systems  Constitutional: Negative for activity change, appetite change, chills, fatigue and fever.  HENT: Positive for dental problem. Negative for congestion, ear pain, rhinorrhea, sinus pressure, sore throat and trouble swallowing.   Eyes: Negative for discharge and redness.  Respiratory: Negative for cough, chest tightness and shortness of breath.   Cardiovascular: Negative for chest pain.  Gastrointestinal: Negative for abdominal pain, diarrhea, nausea and vomiting.  Musculoskeletal: Negative for myalgias.  Skin: Negative for rash.  Neurological: Negative for dizziness, light-headedness and headaches.  Physical Exam Triage Vital Signs ED Triage Vitals  Enc Vitals Group     BP 10/27/19 1632 112/71     Pulse Rate 10/27/19 1632 83     Resp 10/27/19 1632 14     Temp 10/27/19 1632 98.3 F (36.8 C)     Temp src --      SpO2 10/27/19 1632 100 %     Weight --      Height --      Head Circumference --      Peak Flow --      Pain Score 10/27/19 1633 10     Pain Loc --      Pain Edu? --      Excl. in [place]? --    No data found.  Updated Vital Signs BP 112/71   Pulse 83   Temp 98.3 F (36.8 C)   Resp 14   LMP 10/14/2019 (Approximate)   SpO2 100%    Visual Acuity Right Eye Distance:   Left Eye Distance:   Bilateral Distance:    Right Eye Near:   Left Eye Near:    Bilateral Near:     Physical Exam Vitals and nursing note reviewed.  Constitutional:      Appearance: She is well-developed.     Comments: No acute distress  HENT:     Head: Normocephalic and atraumatic.     Comments: No facial swelling    Ears:     Comments: Bilateral ears without tenderness to palpation of external auricle, tragus and mastoid, EAC's without erythema or swelling, TM's with good bony landmarks and cone of light. Non erythematous.    Nose: Nose normal.     Mouth/Throat:     Comments: Oral mucosa pink and moist, no tonsillar enlargement or exudate. Posterior pharynx patent and nonerythematous, no uvula deviation or swelling. Normal phonation. No soft palate swelling Right lower posterior molar with gingiva that does appear erythematous and mildly swollen, tender to touch, no obvious fracture or decay of tooth Eyes:     Conjunctiva/sclera: Conjunctivae normal.  Neck:     Comments: No overlying neck swelling or erythema, full active range of motion of neck Cardiovascular:     Rate and Rhythm: Normal rate.  Pulmonary:     Effort: Pulmonary effort is normal. No respiratory distress.  Abdominal:     General: There is no distension.  Musculoskeletal:        General: Normal range of motion.     Cervical back: Neck supple.  Skin:    General: Skin is warm and dry.  Neurological:     Mental Status: She is alert and oriented to person, place, and time.      UC Treatments / Results  Labs (all labs ordered are listed, but only abnormal results are displayed) Labs Reviewed - No data to display  EKG   Radiology No results found.  Procedures Procedures (including critical care time)  Medications Ordered in UC Medications - No data to display  Initial Impression / Assessment and Plan / UC Course  I have reviewed the triage vital signs and  the nursing notes.  Pertinent labs & imaging results that were available during my care of the patient were reviewed by me and considered in my medical decision making (see chart for details).     With dental pain, possible infection contributing to discomfort.  Will provide amoxicillin to treat for this, continue Tylenol, and and ibuprofen.  Vital signs stable, no sign  of deep space infection.  Continue to monitor,Discussed strict return precautions. Patient verbalized understanding and is agreeable with plan.  Final Clinical Impressions(s) / UC Diagnoses   Final diagnoses:  Pain, dental     Discharge Instructions     Begin amoxicillin to treat infection causing pain  For pain please take 600mg -800mg  of Ibuprofen every 8 hours, take with 1000 mg of Tylenol Extra strength every 8 hours. These are safe to take together. Please take with food.   Please return if you start to experience significant swelling of your face, experiencing fever.   ED Prescriptions    Medication Sig Dispense Auth. Provider   ibuprofen (ADVIL) 800 MG tablet Take 1 tablet (800 mg total) by mouth 3 (three) times daily. 21 tablet Zakariah Dejarnette C, PA-C   amoxicillin (AMOXIL) 500 MG capsule Take 1 capsule (500 mg total) by mouth 3 (three) times daily for 7 days. 21 capsule Dannon Perlow, [place] C, PA-C     PDMP not reviewed this encounter.   Raffaela Ladley, [place] C, PA-C 10/28/19 1004

## 2019-12-15 ENCOUNTER — Other Ambulatory Visit: Payer: Self-pay

## 2019-12-15 ENCOUNTER — Encounter (HOSPITAL_COMMUNITY): Payer: Self-pay

## 2019-12-15 ENCOUNTER — Ambulatory Visit (HOSPITAL_COMMUNITY)
Admission: EM | Admit: 2019-12-15 | Discharge: 2019-12-15 | Disposition: A | Discharge status: Home / Self Care | Payer: Medicaid Other | Attending: Family Medicine | Admitting: Family Medicine

## 2019-12-15 ENCOUNTER — Telehealth (HOSPITAL_COMMUNITY): Payer: Self-pay

## 2019-12-15 DIAGNOSIS — E119 Type 2 diabetes mellitus without complications: Secondary | ICD-10-CM

## 2019-12-15 DIAGNOSIS — E039 Hypothyroidism, unspecified: Secondary | ICD-10-CM

## 2019-12-15 MED ORDER — METFORMIN HCL 500 MG PO TABS: ORAL_TABLET | ORAL | 2 refills | Status: AC

## 2019-12-15 MED ORDER — LEVOTHYROXINE SODIUM 125 MCG PO TABS: 125.0000 ug | ORAL_TABLET | Freq: Every day | ORAL | 2 refills | Status: DC

## 2019-12-15 NOTE — ED Triage Notes (Signed)
Pt states she needs her meds refilled.  Metformin 500 mg and levothyroxine 125 mg.

## 2019-12-15 NOTE — ED Provider Notes (Signed)
[place]   [id number] 12/15/19 Arrival Time: 8119  ASSESSMENT & PLAN:  1. Hypothyroidism, unspecified type   2. Type 2 diabetes mellitus without complication, without long-term current use of insulin ([place])     Meds ordered this encounter  Medications  . levothyroxine (SYNTHROID) 125 MCG tablet    Sig: Take 1 tablet (125 mcg total) by mouth daily before breakfast.    Dispense:  30 tablet    Refill:  2  . metFORMIN (GLUCOPHAGE) 500 MG tablet    Sig: TAKE 1 TABLET BY MOUTH ONCE DAILY WITH BREAKFAST    Dispense:  30 tablet    Refill:  2    Reviewed expectations re: course of current medical issues. Questions answered. Outlined signs and symptoms indicating need for more acute intervention. Patient verbalized understanding. After Visit Summary given.   SUBJECTIVE: History from: patient. Margaret Walsh is a 29 y.o. female who presents requesting medication refill. No current concerns.  Current medical problems include: Past Medical History:  Diagnosis Date  . COVID-19 virus detected   . Diabetes mellitus without complication ([place])   . Pancreatitis   . Thyroid disease    Reports both DM and hypothyroidism are controlled.  No current facility-administered medications for this encounter.  Current Outpatient Medications:  .  fluticasone (FLONASE) 50 MCG/ACT nasal spray, Place 1 spray into both nostrils daily., Disp: 16 g, Rfl: 2 .  ibuprofen (ADVIL) 800 MG tablet, Take 1 tablet (800 mg total) by mouth 3 (three) times daily., Disp: 21 tablet, Rfl: 0 .  levothyroxine (SYNTHROID) 125 MCG tablet, Take 1 tablet (125 mcg total) by mouth daily before breakfast., Disp: 30 tablet, Rfl: 2 .  metFORMIN (GLUCOPHAGE) 500 MG tablet, TAKE 1 TABLET BY MOUTH ONCE DAILY WITH BREAKFAST, Disp: 30 tablet, Rfl: 2 .  UNKNOWN TO PATIENT, BCPs, Disp: , Rfl:   For Reqested Medication Refill: Reason for request:  clinic/provider not available Medications taken before: yes - see home  medications   Patient has complete original prescription information: Yes     OBJECTIVE:  Vitals:   12/15/19 1343 12/15/19 1345  BP:  126/74  Pulse:  83  Resp:  16  Temp:  98.4 F (36.9 C)  TempSrc:  Oral  SpO2:  99%  Weight: 75.8 kg     General appearance: alert; no distress Lungs: unlabored Heart: regular Skin: warm and dry Neurologic: normal gait Psychological: alert and cooperative; normal mood and affect   Allergies  Allergen Reactions  . Thyroid Hormones     Allergic to unknown thyroid med  . Methimazole Rash    Social History   Socioeconomic History  . Marital status: Single    Spouse name: Not on file  . Number of children: Not on file  . Years of education: Not on file  . Highest education level: Not on file  Occupational History  . Not on file  Tobacco Use  . Smoking status: Former Smoker    Quit date: 07/31/2018    Years since quitting: 1.3  . Smokeless tobacco: Never Used  Substance and Sexual Activity  . Alcohol use: No  . Drug use: Not Currently    Types: Marijuana  . Sexual activity: Not on file  Other Topics Concern  . Not on file  Social History Narrative  . Not on file   Social Determinants of Health   Financial Resource Strain:   . Difficulty of Paying Living Expenses: Not on file  Food Insecurity:   . Worried  About Running Out of Food in the Last Year: Not on file  . Ran Out of Food in the Last Year: Not on file  Transportation Needs:   . Lack of Transportation (Medical): Not on file  . Lack of Transportation (Non-Medical): Not on file  Physical Activity:   . Days of Exercise per Week: Not on file  . Minutes of Exercise per Session: Not on file  Stress:   . Feeling of Stress : Not on file  Social Connections:   . Frequency of Communication with Friends and Family: Not on file  . Frequency of Social Gatherings with Friends and Family: Not on file  . Attends Religious Services: Not on file  . Active Member of Clubs or  Organizations: Not on file  . Attends Banker Meetings: Not on file  . Marital Status: Not on file  Intimate Partner Violence:   . Fear of Current or Ex-Partner: Not on file  . Emotionally Abused: Not on file  . Physically Abused: Not on file  . Sexually Abused: Not on file   Family History  Problem Relation Age of Onset  . Healthy Mother    Past Surgical History:  Procedure Laterality Date  . HERNIA REPAIR    . Forestine Chute, MD 12/15/19 [phone]

## 2020-03-28 ENCOUNTER — Ambulatory Visit
Admission: EM | Admit: 2020-03-28 | Discharge: 2020-03-28 | Disposition: A | Discharge status: Home / Self Care | Payer: Medicaid Other | Attending: Physician Assistant | Admitting: Physician Assistant

## 2020-03-28 ENCOUNTER — Encounter: Payer: Self-pay | Admitting: Emergency Medicine

## 2020-03-28 ENCOUNTER — Other Ambulatory Visit: Payer: Self-pay

## 2020-03-28 DIAGNOSIS — J029 Acute pharyngitis, unspecified: Secondary | ICD-10-CM

## 2020-03-28 DIAGNOSIS — Z3201 Encounter for pregnancy test, result positive: Secondary | ICD-10-CM

## 2020-03-28 DIAGNOSIS — R059 Cough, unspecified: Secondary | ICD-10-CM

## 2020-03-28 LAB — POCT URINE PREGNANCY: Preg Test, Ur: POSITIVE — AB

## 2020-03-28 MED ORDER — FLUTICASONE PROPIONATE 50 MCG/ACT NA SUSP: 1.0000 | Freq: Every day | NASAL | 0 refills | Status: DC

## 2020-03-28 NOTE — ED Provider Notes (Signed)
[facility]    CSN: [id number] Arrival date & time: 03/28/20  1131      History   Chief Complaint Chief Complaint  Patient presents with  . Sore Throat  . Nasal Congestion    HPI Margaret Walsh is a 29 y.o. female.   29 year old female comes in for 2 day of URI symptoms. Has had sore throat, nasal congestion. States coughing due to throat irritation. States this started after attending a funeral. Denies fever, chills, body aches. Denies abdominal pain, nausea, vomiting, diarrhea. Denies shortness of breath, loss of taste/smell. Had COVID 10/2019, symptoms completely resolve.   Patient with positive home pregnancy test, and would like to get confirmation testing today. LMP 03/12/2020.      Past Medical History:  Diagnosis Date  . COVID-19 virus detected   . Diabetes mellitus without complication ([facility])   . Pancreatitis   . Thyroid disease     Patient Active Problem List   Diagnosis Date Noted  . 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 Sinus tachycardia 10/18/2018  . Hypothyroidism 10/18/2018  . Acute kidney injury ([facility]) 10/18/2018  . Fever     Past Surgical History:  Procedure Laterality Date  . HERNIA REPAIR    . THYROIDECTOMY      OB History    Gravida  1   Para      Term      Preterm      AB      Living        SAB      TAB      Ectopic      Multiple      Live Births               Home Medications    Prior to Admission medications   Medication Sig Start Date End Date Taking? Authorizing Provider  fluticasone (FLONASE) 50 MCG/ACT nasal spray Place 1 spray into both nostrils daily. 03/28/20   Margaret Walsh, Margaret Noori V, PA-Walsh  ibuprofen (ADVIL) 800 MG tablet Take 1 tablet (800 mg total) by mouth 3 (three) times daily. 10/27/19   Wieters, Hallie C, PA-Walsh  levothyroxine (SYNTHROID) 125 MCG tablet Take 1 tablet (125 mcg total) by mouth daily before breakfast. 12/15/19   Margaret Layman, MD  metFORMIN (GLUCOPHAGE) 500 MG tablet TAKE 1 TABLET BY MOUTH ONCE  DAILY WITH BREAKFAST 12/15/19   Margaret Layman, MD  UNKNOWN TO PATIENT BCPs    [provider]    Family History Family History  Problem Relation Age of Onset  . Margaret Walsh     Social History Social History   Tobacco Use  . Smoking status: Former Smoker    Quit date: 07/31/2018    Years since quitting: 1.6  . Smokeless tobacco: Never Used  Substance Use Topics  . Alcohol use: No  . Drug use: Not Currently    Types: Marijuana     Allergies   Thyroid hormones and Methimazole   Review of Systems Review of Systems  Reason unable to perform ROS: See HPI as above.     Physical Exam Triage Vital Signs ED Triage Vitals [03/28/20 1145]  Enc Vitals Group     BP 136/72     Pulse Rate (!) 102     Resp 18     Temp 99.8 F (37.7 Walsh)     Temp Source Oral     SpO2 97 %     Weight      Height  Head Circumference      Peak Flow      Pain Score 3     Pain Loc      Pain Edu?      Excl. in [place]?    No data found.  Updated Vital Signs BP 136/72 (BP Location: Left Arm)   Pulse (!) 102   Temp 99.8 F (37.7 Walsh) (Oral)   Resp 18   LMP 03/12/2020   SpO2 97%   Physical Exam Constitutional:      General: She is not in acute distress.    Appearance: Normal appearance. She is well-developed. She is not ill-appearing, toxic-appearing or diaphoretic.  HENT:     Head: Normocephalic and atraumatic.     Right Ear: Tympanic membrane, ear canal and external ear normal. Tympanic membrane is not erythematous or bulging.     Left Ear: Tympanic membrane, ear canal and external ear normal. Tympanic membrane is not erythematous or bulging.     Nose:     Right Sinus: No maxillary sinus tenderness or frontal sinus tenderness.     Left Sinus: No maxillary sinus tenderness or frontal sinus tenderness.     Mouth/Throat:     Mouth: Mucous membranes are moist.     Pharynx: Oropharynx is clear. Uvula midline.  Eyes:     Conjunctiva/sclera: Conjunctivae normal.     Pupils:  Pupils are equal, round, and reactive to light.  Cardiovascular:     Rate and Rhythm: Normal rate and regular rhythm.  Pulmonary:     Effort: Pulmonary effort is normal. No accessory muscle usage, prolonged expiration, respiratory distress or retractions.     Breath sounds: No decreased air movement or transmitted upper airway sounds. No decreased breath sounds.     Comments: LCTAB Musculoskeletal:     Cervical back: Normal range of motion and neck supple.  Skin:    General: Skin is warm and dry.  Neurological:     Mental Status: She is alert and oriented to person, place, and time.    UC Treatments / Results  Labs (all labs ordered are listed, but only abnormal results are displayed) Labs Reviewed  POCT URINE PREGNANCY - Abnormal; Notable for the following components:      Result Value   Preg Test, Ur Positive (*)    All other components within normal limits  NOVEL CORONAVIRUS, NAA    EKG   Radiology No results found.  Procedures Procedures (including critical care time)  Medications Ordered in UC Medications - No data to display  Initial Impression / Assessment and Plan / UC Course  I have reviewed the triage vital signs and the nursing notes.  Pertinent labs & imaging results that were available during my care of the patient were reviewed by me and considered in my medical decision making (see chart for details).     1. URI symptoms (sore throat, cough) COVID PCR test ordered. Patient to quarantine until testing results return. No alarming signs on exam.  Patient speaking in full sentences without respiratory distress.  Symptomatic treatment discussed.  Push fluids.  Return precautions given.  Patient expresses understanding and agrees to plan.  2. Positive urine pregnancy test Urine preg positive. Start prenatal vitamins. Return precautions given. Otherwise, to follow up with OBGYN for further evaluation needed.  Final Clinical Impressions(s) / UC Diagnoses    Final diagnoses:  Sore throat  Cough  Positive urine pregnancy test   ED Prescriptions    Medication Sig Dispense Auth. Provider  fluticasone (FLONASE) 50 MCG/ACT nasal spray Place 1 spray into both nostrils daily. 16 g Belinda Fisher, PA-Walsh     PDMP not reviewed this encounter.   Belinda Fisher, PA-Walsh 03/28/20 1221

## 2020-03-28 NOTE — ED Triage Notes (Addendum)
Pt sts sore throat and nasal congestion x 2 days after attending a funeral; pt had positive home pregnancy test today with LMP 03/12/20; pt had covid in December 2020

## 2020-03-28 NOTE — Discharge Instructions (Addendum)
Sore throat/nasal congestion: COVID PCR testing ordered. I would like you to quarantine until testing results. Flonase for congestion/drainage. Tylenolfor pain and fever. Keep hydrated, urine should be clear to pale yellow in color. If experiencing shortness of breath, trouble breathing, go to the emergency department for further evaluation needed.   For sore throat/cough try using a honey-based tea. Use 3 teaspoons of honey with juice squeezed from half lemon. Place shaved pieces of ginger into 1/2-1 cup of water and warm over stove top. Then mix the ingredients and repeat every 4 hours as needed.  Positive urine pregnancy Urine pregnancy positive. Start taking prenatal vitamins. Follow up with OBGYN for further evaluation. If having abdominal pain, vaginal bleeding, go to the women's ED for further evaluation.

## 2020-03-29 LAB — NOVEL CORONAVIRUS, NAA: SARS-CoV-2, NAA: NOT DETECTED

## 2020-03-29 LAB — SARS-COV-2, NAA 2 DAY TAT

## 2020-04-20 ENCOUNTER — Other Ambulatory Visit: Payer: Self-pay

## 2020-04-20 ENCOUNTER — Inpatient Hospital Stay (HOSPITAL_COMMUNITY)
Admission: AD | Admit: 2020-04-20 | Discharge: 2020-04-20 | Disposition: A | Discharge status: Home / Self Care | Payer: Medicaid Other | Attending: Obstetrics and Gynecology | Admitting: Obstetrics and Gynecology

## 2020-04-20 ENCOUNTER — Inpatient Hospital Stay (HOSPITAL_COMMUNITY): Payer: Medicaid Other

## 2020-04-20 ENCOUNTER — Encounter (HOSPITAL_COMMUNITY): Payer: Self-pay | Admitting: Obstetrics and Gynecology

## 2020-04-20 DIAGNOSIS — Z87891 Personal history of nicotine dependence: Secondary | ICD-10-CM | POA: Insufficient documentation

## 2020-04-20 DIAGNOSIS — Z7984 Long term (current) use of oral hypoglycemic drugs: Secondary | ICD-10-CM | POA: Diagnosis not present

## 2020-04-20 DIAGNOSIS — O99281 Endocrine, nutritional and metabolic diseases complicating pregnancy, first trimester: Secondary | ICD-10-CM | POA: Insufficient documentation

## 2020-04-20 DIAGNOSIS — Z8616 Personal history of COVID-19: Secondary | ICD-10-CM | POA: Insufficient documentation

## 2020-04-20 DIAGNOSIS — Z79890 Hormone replacement therapy (postmenopausal): Secondary | ICD-10-CM | POA: Diagnosis not present

## 2020-04-20 DIAGNOSIS — E119 Type 2 diabetes mellitus without complications: Secondary | ICD-10-CM | POA: Diagnosis not present

## 2020-04-20 DIAGNOSIS — R1012 Left upper quadrant pain: Secondary | ICD-10-CM | POA: Diagnosis not present

## 2020-04-20 DIAGNOSIS — E079 Disorder of thyroid, unspecified: Secondary | ICD-10-CM | POA: Diagnosis not present

## 2020-04-20 DIAGNOSIS — Z3A01 Less than 8 weeks gestation of pregnancy: Secondary | ICD-10-CM

## 2020-04-20 DIAGNOSIS — R1032 Left lower quadrant pain: Secondary | ICD-10-CM

## 2020-04-20 DIAGNOSIS — O26891 Other specified pregnancy related conditions, first trimester: Secondary | ICD-10-CM | POA: Diagnosis not present

## 2020-04-20 DIAGNOSIS — O24111 Pre-existing diabetes mellitus, type 2, in pregnancy, first trimester: Secondary | ICD-10-CM | POA: Insufficient documentation

## 2020-04-20 DIAGNOSIS — R109 Unspecified abdominal pain: Secondary | ICD-10-CM | POA: Diagnosis present

## 2020-04-20 DIAGNOSIS — O26899 Other specified pregnancy related conditions, unspecified trimester: Secondary | ICD-10-CM

## 2020-04-20 LAB — URINALYSIS, ROUTINE W REFLEX MICROSCOPIC
Bilirubin Urine: NEGATIVE
Glucose, UA: NEGATIVE mg/dL
Hgb urine dipstick: NEGATIVE
Ketones, ur: NEGATIVE mg/dL
Leukocytes,Ua: NEGATIVE
Nitrite: NEGATIVE
Protein, ur: NEGATIVE mg/dL
Specific Gravity, Urine: 1.008 (ref 1.005–1.030)
pH: 6 (ref 5.0–8.0)

## 2020-04-20 LAB — CBC
HCT: 36.1 % (ref 36.0–46.0)
Hemoglobin: 11.5 g/dL — ABNORMAL LOW (ref 12.0–15.0)
MCH: 25.6 pg — ABNORMAL LOW (ref 26.0–34.0)
MCHC: 31.9 g/dL (ref 30.0–36.0)
MCV: 80.2 fL (ref 80.0–100.0)
Platelets: 173 10*3/uL (ref 150–400)
RBC: 4.5 MIL/uL (ref 3.87–5.11)
RDW: 14.5 % (ref 11.5–15.5)
WBC: 7.5 10*3/uL (ref 4.0–10.5)
nRBC: 0 % (ref 0.0–0.2)

## 2020-04-20 LAB — ABO/RH: ABO/RH(D): A POS

## 2020-04-20 LAB — WET PREP, GENITAL
Clue Cells Wet Prep HPF POC: NONE SEEN
Sperm: NONE SEEN
Trich, Wet Prep: NONE SEEN
Yeast Wet Prep HPF POC: NONE SEEN

## 2020-04-20 LAB — HCG, QUANTITATIVE, PREGNANCY: hCG, Beta Chain, Quant, S: 103771 m[IU]/mL — ABNORMAL HIGH (ref <5)

## 2020-04-20 NOTE — ED Provider Notes (Signed)
MSE was initiated and I personally evaluated the patient and placed orders (if any) at  2:04 PM on April 20, 2020.  Briefly this is a 29 year old female with past medical history significant for type 2 diabetes presenting with abdominal pain x2 days.  Pain is located on her left upper and lower quadrants.  She describes it as a cramping sensation.  Patient tells me she is [redacted] weeks pregnant.  This is her second pregnancy.  She saw Dr. Carlyon Prows OB in the past and plans on seeing him this pregnancy as well.  She has an appointment scheduled next week for her first ultrasound.  She denies any fever, chills, chest pain conference of breath, nausea, vomiting, urinary symptoms, diarrhea.  Reports her LMP was 03/12/2020.  No medications for symptoms prior to arrival.  Abdominal surgical history includes hernia repair.  PE: Constitutional: well-developed, well-nourished, no apparent distress HENT: normocephalic, atraumatic. no cervical adenopathy Cardiovascular: normal rate and rhythm, distal pulses intact Pulmonary/Chest: effort normal; breath sounds clear and equal bilaterally; no wheezes or rales Abdominal: soft, tenderness to palpation of left upper and lower quadrants.  Normoactive bowel sounds Musculoskeletal: full ROM, no edema Neurological: alert with goal directed thinking Skin: warm and dry, no rash, no diaphoresis Psychiatric: normal mood and affect, normal behavior    The patient appears stable so that the remainder of the MSE may be completed by another provider. Discussed with MAU APP Heather who accepts patient in transfer.   Portions of this note were generated with Scientist, clinical (histocompatibility and immunogenetics). Dictation errors may occur despite best attempts at proofreading.    Kathyrn Lass 04/20/20 1420    Gerhard Munch, MD 04/20/20 2102

## 2020-04-20 NOTE — MAU Note (Signed)
.   Margaret Walsh is a 29 y.o. at [redacted]w[redacted]d here in MAU reporting: pain in her left upper abdomen. Denies any vaginal bleeding LMP: 03/05/20 Onset of complaint: this am Pain score: 4 Vitals:   04/20/20 1257 04/20/20 1511  BP: 130/78 128/71  Pulse: 91 73  Resp: 18 16  Temp: 98 F (36.7 C) 98 F (36.7 C)  SpO2: 97% 100%     FHT: Lab orders placed from triage: UA

## 2020-04-20 NOTE — MAU Provider Note (Signed)
[facility]     CSN: [id number]  Arrival date and time: 04/20/20 1254   First Provider Initiated Contact with Patient 04/20/20 1527      Chief Complaint  Patient presents with  . Abdominal Pain   Jasemine Ruest is a 29 y.o. G2P1001 at [redacted]w[redacted]d who presents today abdominal pain. She states that she has had the pain for several days and it comes and goes. She reports that she had hernia surgery with mesh in 2020 and she is worried that maybe the pregnancy is causing a complication with the mesh/hernia repair.   Abdominal Pain This is a new problem. The current episode started in the past 7 days. The problem occurs intermittently. The problem has been unchanged. The pain is located in the LUQ. The pain is at a severity of 5/10. The abdominal pain does not radiate. Pertinent negatives include no dysuria, fever, frequency, nausea or vomiting. Nothing aggravates the pain. The pain is relieved by nothing. She has tried nothing for the symptoms.    OB History    Gravida  2   Para  1   Term  1   Preterm      AB      Living  1     SAB      TAB      Ectopic      Multiple      Live Births  1           Past Medical History:  Diagnosis Date  . COVID-19 virus detected   . Diabetes mellitus without complication ([facility])   . Pancreatitis   . Thyroid disease     Past Surgical History:  Procedure Laterality Date  . HERNIA REPAIR    . THYROIDECTOMY      Family History  Problem Relation Age of Onset  . Healthy Mother     Social History   Tobacco Use  . Smoking status: Former Smoker    Quit date: 07/31/2018    Years since quitting: 1.7  . Smokeless tobacco: Never Used  Substance Use Topics  . Alcohol use: No  . Drug use: Not Currently    Types: Marijuana    Allergies:  Allergies  Allergen Reactions  . Thyroid Hormones     Allergic to unknown thyroid med  . Methimazole Rash    Medications Prior to Admission  Medication Sig Dispense Refill Last Dose  .  levothyroxine (SYNTHROID) 125 MCG tablet Take 1 tablet (125 mcg total) by mouth daily before breakfast. 30 tablet 2 04/20/2020 at Unknown time  . metFORMIN (GLUCOPHAGE) 500 MG tablet TAKE 1 TABLET BY MOUTH ONCE DAILY WITH BREAKFAST 30 tablet 2 04/19/2020 at Unknown time  . Prenatal Vit-Fe Fumarate-FA (PRENATAL MULTIVITAMIN) TABS tablet Take 1 tablet by mouth daily at 12 noon.   04/19/2020 at Unknown time  . fluticasone (FLONASE) 50 MCG/ACT nasal spray Place 1 spray into both nostrils daily. 16 g 0   . ibuprofen (ADVIL) 800 MG tablet Take 1 tablet (800 mg total) by mouth 3 (three) times daily. 21 tablet 0   . UNKNOWN TO PATIENT BCPs       Review of Systems  Constitutional: Negative for chills and fever.  Gastrointestinal: Positive for abdominal pain. Negative for nausea and vomiting.  Genitourinary: Negative for dysuria, frequency, pelvic pain, vaginal bleeding and vaginal discharge.   Physical Exam   Blood pressure 128/71, pulse 73, temperature 98 F (36.7 C), resp. rate 16, height 5\' 3"  (1.6 m), weight 78.9  kg, last menstrual period 03/05/2020, SpO2 100 %.  Physical Exam  Nursing note and vitals reviewed. Constitutional: She is oriented to person, place, and time. She appears well-developed and well-nourished. No distress.  HENT:  Head: Normocephalic.  Cardiovascular: Normal rate.  Respiratory: Effort normal.  GI: Soft. There is no abdominal tenderness.  Neurological: She is alert and oriented to person, place, and time.  Skin: Skin is warm and dry.  Psychiatric: She has a normal mood and affect.    Results for orders placed or performed during the hospital encounter of 04/20/20 (from the past 24 hour(s))  Urinalysis, Routine w reflex microscopic     Status: Abnormal   Collection Time: 04/20/20  3:10 PM  Result Value Ref Range   Color, Urine STRAW (A) YELLOW   APPearance CLEAR CLEAR   Specific Gravity, Urine 1.008 1.005 - 1.030   pH 6.0 5.0 - 8.0   Glucose, UA NEGATIVE NEGATIVE  mg/dL   Hgb urine dipstick NEGATIVE NEGATIVE   Bilirubin Urine NEGATIVE NEGATIVE   Ketones, ur NEGATIVE NEGATIVE mg/dL   Protein, ur NEGATIVE NEGATIVE mg/dL   Nitrite NEGATIVE NEGATIVE   Leukocytes,Ua NEGATIVE NEGATIVE  CBC     Status: Abnormal   Collection Time: 04/20/20  3:49 PM  Result Value Ref Range   WBC 7.5 4.0 - 10.5 K/uL   RBC 4.50 3.87 - 5.11 MIL/uL   Hemoglobin 11.5 (L) 12.0 - 15.0 g/dL   HCT 16.1 09.6 - 04.5 %   MCV 80.2 80.0 - 100.0 fL   MCH 25.6 (L) 26.0 - 34.0 pg   MCHC 31.9 30.0 - 36.0 g/dL   RDW 40.9 81.1 - 91.4 %   Platelets 173 150 - 400 K/uL   nRBC 0.0 0.0 - 0.2 %  ABO/Rh     Status: None   Collection Time: 04/20/20  3:49 PM  Result Value Ref Range   ABO/RH(D) A POS    No rh immune globuloin      NOT A RH IMMUNE GLOBULIN CANDIDATE, PT RH POSITIVE Performed at [facility], [address], [place], [place] [address]   Wet prep, genital     Status: Abnormal   Collection Time: 04/20/20  3:58 PM  Result Value Ref Range   Yeast Wet Prep HPF POC NONE SEEN NONE SEEN   Trich, Wet Prep NONE SEEN NONE SEEN   Clue Cells Wet Prep HPF POC NONE SEEN NONE SEEN   WBC, Wet Prep HPF POC FEW (A) NONE SEEN   Sperm NONE SEEN    US OB Comp Less 14 Wks  Result Date: 04/20/2020 CLINICAL DATA:  Abdominal pain. EXAM: OBSTETRIC <14 WK ULTRASOUND TECHNIQUE: Transabdominal ultrasound was performed for evaluation of the gestation as well as the maternal uterus and adnexal regions. COMPARISON:  None. FINDINGS: Intrauterine gestational sac: Single Yolk sac:  Visualized. Embryo:  Visualized. Cardiac Activity: Visualized. Heart Rate: 144 bpm MSD:  21.7 mm   7 w   0 d CRL:   7.3 mm   6 w 4 d                  [place] EDC: December 10, 2020 Subchorionic hemorrhage:  A small subchorionic hemorrhage is noted. Maternal uterus/adnexae: The right ovary measures 3.3 cm x 1.8 cm x 1.7 cm and is normal in appearance. The left ovary measures 2.8 cm x 2.4 cm x 1.8 cm and is normal in appearance.  IMPRESSION: 1. Single, viable intrauterine pregnancy at approximately 6 weeks  and 4 days gestation by ultrasound evaluation. 2. Small subchorionic hemorrhage. Electronically Signed   By: Virgina Norfolk M.D.   On: 04/20/2020 16:41    MAU Course  Procedures  MDM  Assessment and Plan   1. [redacted] weeks gestation of pregnancy   2. Abdominal pain in pregnancy    DC home Comfort measures reviewed  1st Trimester precautions  Bleeding precautions RX: none  Return to MAU as needed FU with OB as planned  Follow-up Information    Glory Buff, MD Follow up.   Specialty: Obstetrics and Gynecology Contact information: [address] [place] [address] [phone]          Marcille Buffy DNP, CNM  04/20/20  4:52 PM

## 2020-04-20 NOTE — Discharge Instructions (Signed)
Primer trimestre de embarazo °First Trimester of Pregnancy °El primer trimestre de embarazo se extiende desde la semana 1 hasta el final de la semana 13 (mes 1 al mes 3). Una semana después de que un espermatozoide fecunda un óvulo, este se implantará en la pared uterina. Este embrión comenzará a desarrollarse hasta convertirse en un bebé. Sus genes y los de su pareja forman el bebé. Los genes del varón determinan si será un niño o una niña. Entre la semana 6 y la 8, se forman los ojos y el rostro, y los latidos del corazón pueden verse en una ecografía. Al final de las 12 semanas, todos los órganos del bebé están formados. °Ahora que está embarazada, querrá hacer todo lo que esté a su alcance para tener un bebé sano. Dos de las cosas más importantes son tener un buen cuidado prenatal y seguir las indicaciones del médico. El cuidado prenatal incluye toda la asistencia médica que usted recibe antes del nacimiento del bebé. Esto ayudará a prevenir, detectar y tratar cualquier problema durante el embarazo y el parto. °Durante el primer trimestre, ocurren cambios en el cuerpo. °Su organismo atraviesa por muchos cambios durante el embarazo. Estos cambios varían de una mujer a otra. °· Al principio, puede aumentar o bajar algunos kilos. °· Puede tener malestar estomacal (náuseas) y vomitar. Si no puede controlar los vómitos, llame al médico. °· Puede cansarse con facilidad. °· Es posible que tenga dolores de cabeza que pueden aliviarse con ciertos medicamentos. Todos los medicamentos que tome deben estar aprobados por el médico. °· Puede orinar con mayor frecuencia. El dolor al orinar puede significar que usted tiene una infección de la vejiga. °· Debido al embarazo, puede tener acidez estomacal. °· Puede estar estreñida, ya que ciertas hormonas enlentecen los movimientos de los músculos que empujan las heces a través del intestino. °· Pueden aparecer hemorroides o hincharse las venas (venas varicosas). °· Las mamas  pueden empezar a agrandarse y estar sensibles. Los pezones pueden sobresalir más, y el tejido que los rodea (aréola) tornarse más oscuro. °· Las encías pueden sangrar y estar sensibles al cepillado y al hilo dental. °· Pueden aparecer zonas oscuras o manchas (cloasma, máscara del embarazo) en el rostro. Esto probablemente se atenuará después del nacimiento del bebé. °· Los períodos menstruales se interrumpirán. °· Tal vez no tenga apetito. °· Puede sentir un fuerte deseo de consumir ciertos alimentos. °· Puede tener cambios a nivel emocional día a día, por ejemplo, por momentos puede estar emocionada por el embarazo y por otros preocuparse porque algo pueda salir mal con el embarazo o el bebé. °· Tendrá sueños más vívidos y extraños. °· Tal vez haya cambios en el cabello. Esto cambios pueden incluir su engrosamiento, crecimiento rápido y cambios en la textura. Además, a algunas mujeres se les cae el cabello durante o después del embarazo, o tienen el cabello seco o fino. Lo más probable es que el cabello se le normalice después del nacimiento del bebé. °Qué debe esperar en las visitas prenatales °Durante una visita prenatal de rutina: °· La pesarán para asegurarse de que usted y el bebé están creciendo normalmente. °· Le tomarán la presión arterial. °· Le medirán el abdomen para controlar el desarrollo del bebé. °· Se escucharán los latidos cardíacos fetales entre las semanas 10 y 14 de embarazo. °· Se analizarán los resultados de los estudios solicitados en visitas anteriores. °El médico puede preguntarle lo siguiente: °· Cómo se siente. °· Si siente los movimientos del bebé. °· Si ha tenido síntomas anormales, como pérdida de líquido, sangrado,   dolores de cabeza intensos o cólicos abdominales. °· Si está consumiendo algún producto que contenga tabaco, como cigarrillos, tabaco de mascar y cigarrillos electrónicos. °· Si tiene alguna pregunta. °Otros estudios que pueden realizarse durante el primer trimestre  incluyen lo siguiente: °· Análisis de sangre para determinar el grupo sanguíneo y detectar la presencia de infecciones previas. Las pruebas también se utilizarán para determinar si tiene bajo nivel de hierro (anemia) y proteínas en los glóbulos rojos (anticuerpos Rh). En función de sus factores de riesgo, o si ya tuvo diabetes durante un embarazo anterior, le pueden hacer pruebas para determinar si tiene un nivel alto de azúcar en la sangre, algo que puede afectar a embarazadas (diabetes gestacional). °· Análisis de orina para detectar infecciones, diabetes o proteínas en la orina. °· Una ecografía para confirmar que el bebé crece y se desarrolla correctamente. °· Estudios fetales para detectar problemas de la médula espinal (espina bífida) y síndrome de Down. °· Prueba del VIH (virus de inmunodeficiencia humana). Los exámenes prenatales de rutina incluyen la prueba de detección del VIH, a menos que decida no realizársela. °· Es posible que necesite otras pruebas adicionales. °Siga estas instrucciones en su casa: °Medicamentos °· Siga las indicaciones del médico en relación con el uso de medicamentos. Durante el embarazo, hay medicamentos que pueden tomarse y otros que no. °· Tome vitaminas prenatales que contengan por lo menos 600 microgramos (?g) de ácido fólico. °· Si está estreñida, tome un laxante suave, si el médico lo autoriza. °Comida y bebida ° °· Lleve una dieta equilibrada que incluya gran cantidad de frutas y verduras frescas, cereales integrales, buenas fuentes de proteínas como carnes magras, huevos o tofu, y lácteos descremados. El médico la ayudará a determinar la cantidad de peso que puede aumentar. °· No coma carne cruda ni quesos sin cocinar. Estos elementos contienen gérmenes que pueden causar defectos congénitos en el bebé. °· La ingesta diaria de cuatro o cinco comidas pequeñas en lugar de tres comidas abundantes puede ayudar a aliviar las náuseas y los vómitos. Si empieza a tener náuseas,  comer algunas galletas saladas puede ser de ayuda. Beber líquidos entre las comidas, en lugar de tomarlos durante las comidas, también puede ayudar a aliviar las náuseas y los vómitos. °· Limite el consumo de alimentos con alto contenido de grasas y azúcares procesados, como alimentos fritos o dulces. °· Para evitar el estreñimiento: °? Consuma alimentos ricos en fibra, como frutas y verduras frescas, cereales integrales y frijoles. °? Beba suficiente líquido para mantener la orina clara o de color amarillo pálido. °Actividad °· Haga ejercicio solamente como se lo haya indicado el médico. La mayoría de las mujeres pueden continuar su rutina de ejercicios durante el embarazo. Intente realizar como mínimo 30 minutos de actividad física por lo menos 5 días a la semana. El ejercicio la ayudará a: °? Controlar su peso. °? Mantenerse en forma. °? Estar preparada para el trabajo de parto y el parto. °· Los dolores, los cólicos en la parte baja del abdomen o los calambres en la cintura son un buen indicio de que debe dejar de hacer ejercicios. Consulte al médico antes de seguir haciendo ejercicios con normalidad. °· Intente no estar de pie durante mucho tiempo. Mueva las piernas con frecuencia si debe estar de pie en un lugar durante mucho tiempo. °· Evite levantar pesos excesivos. °· Use zapatos de tacones bajos y mantenga una buena postura. °· Puede seguir teniendo relaciones sexuales, salvo que el médico le indique lo contrario. °Alivio del dolor y del malestar °·   Use un sostén que le brinde buen soporte para aliviar el dolor de mamas. °· Dese baños de asiento con agua tibia para aliviar el dolor o las molestias causadas por las hemorroides. Use una crema para las hemorroides si el médico la autoriza. °· Descanse con las piernas elevadas si tiene calambres o dolor de cintura. °· Si tiene venas varicosas en las piernas, use medias de descanso. Eleve los pies durante 15 minutos, 3 o 4 veces por día. Limite el consumo de  sal en su dieta. °Cuidados prenatales °· Programe las visitas prenatales para la semana 12 de embarazo. Generalmente se programan cada mes al principio y se hacen más frecuentes en los 2 últimos meses antes del parto. °· Escriba sus preguntas. Llévelas cuando concurra a las visitas prenatales. °· Concurra a todas las visitas prenatales tal como se lo haya indicado el médico. Esto es importante. °Seguridad °· Use el cinturón de seguridad en todo momento mientras conduce. °· Haga una lista de los números de teléfono de emergencia, que incluya los números de teléfono de familiares, amigos, el hospital y los departamentos de policía y bomberos. °Instrucciones generales °· Pídale al médico que la derive a clases de educación prenatal en su localidad. Debe comenzar a tomar las clases antes de que empiece el mes 6 de embarazo. °· Pida ayuda si tiene necesidades nutricionales o de asesoramiento durante el embarazo. El médico puede aconsejarla o derivarla a especialistas para que la ayuden con diferentes necesidades. °· No se dé baños de inmersión en agua caliente, baños turcos ni saunas. °· No se haga duchas vaginales ni use tampones o toallas higiénicas perfumadas. °· No mantenga las piernas cruzadas durante mucho tiempo. °· Evite el contacto con las bandejas sanitarias de los gatos y la tierra que estos animales usan. Estos elementos contienen bacterias que pueden causar defectos congénitos al bebé y la posible pérdida del feto debido a un aborto espontáneo o muerte fetal. °· No fume, no consuma hierbas ni medicamentos que no hayan sido recetados por el médico. Las sustancias químicas que estos productos contienen afectan la formación y el desarrollo del bebé. °· No consuma ningún producto que contenga nicotina o tabaco, como cigarrillos y cigarrillos electrónicos. Si necesita ayuda para dejar de fumar, consulte al médico. Puede recibir asesoramiento y otro tipo de recursos para dejar de fumar. °· Programe una cita con el  dentista. En su casa, lávese los dientes con un cepillo dental blando y pásese el hilo dental con suavidad. °Comuníquese con un médico si: °· Tiene mareos. °· Siente cólicos leves, presión en la pelvis o dolor persistente en el abdomen. °· Tiene náuseas, vómitos o diarrea persistentes. °· Observa una secreción vaginal con mal olor. °· Siente dolor al orinar. °· Observa más hinchazón en la cara, las manos, las piernas o los tobillos. °· Está expuesta a la quinta enfermedad o a la varicela. °· Está expuesta al sarampión alemán (rubéola) y nunca lo había tenido. °Solicite ayuda de inmediato si: °· Tiene fiebre. °· Tiene una pérdida de líquido por la vagina. °· Tiene sangrado o pequeñas pérdidas vaginales. °· Siente dolor intenso o cólicos en el abdomen. °· Sube o baja de peso rápidamente. °· Vomita sangre de color rojo brillante o una sustancia similar a los granos de café. °· Dolor de cabeza intenso. °· Le falta el aire. °· Sufre cualquier tipo de traumatismo, por ejemplo, debido a una caída o un accidente automovilístico. °Resumen °· El primer trimestre de embarazo se extiende desde la semana 1 hasta el final de la   semana 13 (mes 1 al mes 3). °· Su organismo atraviesa por muchos cambios durante el embarazo. Estos cambios varían de una mujer a otra. °· Tendrá visitas prenatales de rutina. Durante esas visitas, el médico la examinará, hablará con usted acerca de los resultados de sus pruebas y le preguntará cómo se siente. °Esta información no tiene como fin reemplazar el consejo del médico. Asegúrese de hacerle al médico cualquier pregunta que tenga. °Document Revised: 02/04/2017 Document Reviewed: 02/04/2017 °Elsevier Patient Education © 2020 [employer] ° °

## 2020-04-21 LAB — GC/CHLAMYDIA PROBE AMP (~~LOC~~) NOT AT ARMC
Chlamydia: NEGATIVE
Comment: NEGATIVE
Comment: NORMAL
Neisseria Gonorrhea: NEGATIVE

## 2021-01-08 ENCOUNTER — Other Ambulatory Visit: Payer: Self-pay

## 2021-01-08 ENCOUNTER — Ambulatory Visit
Admission: EM | Admit: 2021-01-08 | Discharge: 2021-01-08 | Disposition: A | Discharge status: Home / Self Care | Payer: Medicaid Other | Attending: Emergency Medicine | Admitting: Emergency Medicine

## 2021-01-08 DIAGNOSIS — K047 Periapical abscess without sinus: Secondary | ICD-10-CM

## 2021-01-08 MED ORDER — IBUPROFEN 800 MG PO TABS: 800.0000 mg | ORAL_TABLET | Freq: Three times a day (TID) | ORAL | 0 refills | Status: DC

## 2021-01-08 MED ORDER — AMOXICILLIN 500 MG PO CAPS: 500.0000 mg | ORAL_CAPSULE | Freq: Three times a day (TID) | ORAL | 0 refills | Status: AC

## 2021-01-08 NOTE — Discharge Instructions (Signed)
Toma amoxicillin cada 8 horas para el proximo semana Use Ibuprofen 800 mg cada 8 horas y Tylenol 1000 mg cada 4-6 horas para dolor Pon compresas caliente al este lado Regere si no mejoran, empeoran, tiene mas dolor, hinchazon o fiebre

## 2021-01-08 NOTE — ED Triage Notes (Signed)
Patient presents to Urgent Care with complaints of dental pain since yesterday. Patient reports right wisdom tooth pain and swelling to right side of face area.  Treating pain with ibuprofen.

## 2021-01-08 NOTE — ED Provider Notes (Signed)
[facility]    CSN: [id number] Arrival date & time: 01/08/21  1444      History   Chief Complaint Chief Complaint  Patient presents with  . Dental Pain    HPI Margaret Walsh is a 30 y.o. female approximately 1 month postpartum, currently breast-feeding presenting today for evaluation of dental pain.  Reports over the past 2 days has developed increased pain swelling and dental pain to her right lower wisdom tooth.  Has a mild swelling to her right face.  Denies fevers.  Does report some difficulty swallowing,  But denies any difficulty breathing.  HPI  Past Medical History:  Diagnosis Date  . COVID-19 virus detected   . Diabetes mellitus without complication (HCC)   . Pancreatitis   . Thyroid disease     Patient Active Problem List   Diagnosis Date Noted  . 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 Sinus tachycardia 10/18/2018  . Hypothyroidism 10/18/2018  . Acute kidney injury (HCC) 10/18/2018  . Fever     Past Surgical History:  Procedure Laterality Date  . HERNIA REPAIR    . THYROIDECTOMY      OB History    Gravida  2   Para  1   Term  1   Preterm      AB      Living  1     SAB      IAB      Ectopic      Multiple      Live Births  1            Home Medications    Prior to Admission medications   Medication Sig Start Date End Date Taking? Authorizing Provider  amoxicillin (AMOXIL) 500 MG capsule Take 1 capsule (500 mg total) by mouth 3 (three) times daily for 7 days. 01/08/21 01/15/21 Yes Tema Alire C, PA-C  ibuprofen (ADVIL) 800 MG tablet Take 1 tablet (800 mg total) by mouth 3 (three) times daily. 01/08/21  Yes Nekesha Font C, PA-C  fluticasone (FLONASE) 50 MCG/ACT nasal spray Place 1 spray into both nostrils daily. 03/28/20   Belinda Fisher, PA-C  levothyroxine (SYNTHROID) 125 MCG tablet Take 1 tablet (125 mcg total) by mouth daily before breakfast. 12/15/19   Mardella Layman, MD  metFORMIN (GLUCOPHAGE) 500 MG tablet TAKE 1 TABLET BY  MOUTH ONCE DAILY WITH BREAKFAST 12/15/19   Mardella Layman, MD  Prenatal Vit-Fe Fumarate-FA (PRENATAL MULTIVITAMIN) TABS tablet Take 1 tablet by mouth daily at 12 noon.    [provider]    Family History Family History  Problem Relation Age of Onset  . Healthy Mother     Social History Social History   Tobacco Use  . Smoking status: Former Smoker    Quit date: 07/31/2018    Years since quitting: 2.4  . Smokeless tobacco: Never Used  Vaping Use  . Vaping Use: Never used  Substance Use Topics  . Alcohol use: No  . Drug use: Not Currently    Types: Marijuana     Allergies   Thyroid hormones and Methimazole   Review of Systems Review of Systems  Constitutional: Negative for activity change, appetite change, chills, fatigue and fever.  HENT: Positive for dental problem. Negative for congestion, ear pain, rhinorrhea, sinus pressure, sore throat and trouble swallowing.   Eyes: Negative for discharge and redness.  Respiratory: Negative for cough, chest tightness and shortness of breath.   Cardiovascular: Negative for chest pain.  Gastrointestinal: Negative for  abdominal pain, diarrhea, nausea and vomiting.  Musculoskeletal: Negative for myalgias.  Skin: Negative for rash.  Neurological: Negative for dizziness, light-headedness and headaches.     Physical Exam Triage Vital Signs ED Triage Vitals  Enc Vitals Group     BP      Pulse      Resp      Temp      Temp src      SpO2      Weight      Height      Head Circumference      Peak Flow      Pain Score      Pain Loc      Pain Edu?      Excl. in GC?    No data found.  Updated Vital Signs BP 132/89 (BP Location: Left Arm)   Pulse 90   Temp 98.7 F (37.1 C) (Oral)   Resp 16   LMP 03/05/2020   SpO2 97%   Breastfeeding Unknown   Visual Acuity Right Eye Distance:   Left Eye Distance:   Bilateral Distance:    Right Eye Near:   Left Eye Near:    Bilateral Near:     Physical Exam Vitals and  nursing note reviewed.  Constitutional:      Appearance: She is well-developed and well-nourished.     Comments: No acute distress  HENT:     Head: Normocephalic and atraumatic.     Nose: Nose normal.     Mouth/Throat:     Comments: Right lower jaw with gingival swelling and erythema around posterior molar/wisdom tooth, no soft palate swelling Eyes:     Conjunctiva/sclera: Conjunctivae normal.  Cardiovascular:     Rate and Rhythm: Normal rate.  Pulmonary:     Effort: Pulmonary effort is normal. No respiratory distress.  Abdominal:     General: There is no distension.  Musculoskeletal:        General: Normal range of motion.     Cervical back: Neck supple.  Skin:    General: Skin is warm and dry.  Neurological:     Mental Status: She is alert and oriented to person, place, and time.  Psychiatric:        Mood and Affect: Mood and affect normal.      UC Treatments / Results  Labs (all labs ordered are listed, but only abnormal results are displayed) Labs Reviewed - No data to display  EKG   Radiology No results found.  Procedures Procedures (including critical care time)  Medications Ordered in UC Medications - No data to display  Initial Impression / Assessment and Plan / UC Course  I have reviewed the triage vital signs and the nursing notes.  Pertinent labs & imaging results that were available during my care of the patient were reviewed by me and considered in my medical decision making (see chart for details).     Treating for dental infection with amoxicillin, Tylenol and ibuprofen for pain and swelling, warm compresses.  Monitor for gradual resolution.  No signs of airway compromise at this time,Discussed strict return precautions. Patient verbalized understanding and is agreeable with plan.  Final Clinical Impressions(s) / UC Diagnoses   Final diagnoses:  Dental infection     Discharge Instructions     Toma amoxicillin cada 8 horas para el proximo  semana Use Ibuprofen 800 mg cada 8 horas y Tylenol 1000 mg cada 4-6 horas para dolor Pon compresas caliente al [employer]  lado Regere si no mejoran, empeoran, tiene mas dolor, hinchazon o fiebre    ED Prescriptions    Medication Sig Dispense Auth. Provider   ibuprofen (ADVIL) 800 MG tablet Take 1 tablet (800 mg total) by mouth 3 (three) times daily. 21 tablet Joren Rehm C, PA-C   amoxicillin (AMOXIL) 500 MG capsule Take 1 capsule (500 mg total) by mouth 3 (three) times daily for 7 days. 21 capsule Avaleigh Decuir, [place] C, PA-C     PDMP not reviewed this encounter.   Lew Dawes, [place] 01/08/21 1530

## 2021-12-19 ENCOUNTER — Other Ambulatory Visit: Payer: Self-pay

## 2021-12-19 ENCOUNTER — Encounter: Payer: Self-pay | Admitting: Emergency Medicine

## 2021-12-19 ENCOUNTER — Ambulatory Visit
Admission: EM | Admit: 2021-12-19 | Discharge: 2021-12-19 | Disposition: A | Discharge status: Home / Self Care | Payer: Medicaid Other | Attending: Physician Assistant | Admitting: Physician Assistant

## 2021-12-19 DIAGNOSIS — J029 Acute pharyngitis, unspecified: Secondary | ICD-10-CM | POA: Diagnosis present

## 2021-12-19 LAB — POCT RAPID STREP A (OFFICE): Rapid Strep A Screen: NEGATIVE

## 2021-12-19 NOTE — ED Triage Notes (Signed)
Pt here for sore throat x 2 days; denies fever 

## 2021-12-19 NOTE — ED Provider Notes (Signed)
[facility]    CSN: [id number] Arrival date & time: 12/19/21  1349      History   Chief Complaint Chief Complaint  Patient presents with   Sore Throat         HPI Margaret Walsh is a 31 y.o. female.   Patient here today for evaluation of sore throat, nasal congestion that started 2 days ago. She has not had fever. She has mild cough from drainage. She has not taken any medication for symptoms. She states she took at home covid test that was negative. She reports she did see white patches on her throat when looking in the mirror.   The history is provided by the patient.   Past Medical History:  Diagnosis Date   COVID-19 virus detected    Diabetes mellitus without complication (HCC)    Pancreatitis    Thyroid disease     Patient Active Problem List   Diagnosis Date Noted   Myocarditis (HCC) 10/20/2018   Sinus tachycardia 10/18/2018   Hypothyroidism 10/18/2018   Acute kidney injury (HCC) 10/18/2018   Fever     Past Surgical History:  Procedure Laterality Date   HERNIA REPAIR     THYROIDECTOMY      OB History     Gravida  2   Para  1   Term  1   Preterm      AB      Living  1      SAB      IAB      Ectopic      Multiple      Live Births  1            Home Medications    Prior to Admission medications   Medication Sig Start Date End Date Taking? Authorizing Provider  fluticasone (FLONASE) 50 MCG/ACT nasal spray Place 1 spray into both nostrils daily. 03/28/20   Cathie Hoops, Amy V, PA-C  ibuprofen (ADVIL) 800 MG tablet Take 1 tablet (800 mg total) by mouth 3 (three) times daily. 01/08/21   Wieters, Hallie C, PA-C  levothyroxine (SYNTHROID) 125 MCG tablet Take 1 tablet (125 mcg total) by mouth daily before breakfast. 12/15/19   Mardella Layman, MD  metFORMIN (GLUCOPHAGE) 500 MG tablet TAKE 1 TABLET BY MOUTH ONCE DAILY WITH BREAKFAST 12/15/19   Mardella Layman, MD  Prenatal Vit-Fe Fumarate-FA (PRENATAL MULTIVITAMIN) TABS tablet Take 1 tablet  by mouth daily at 12 noon.    [provider]    Family History Family History  Problem Relation Age of Onset   Healthy Mother     Social History Social History   Tobacco Use   Smoking status: Former    Types: Cigarettes    Quit date: 07/31/2018    Years since quitting: 3.3   Smokeless tobacco: Never  Vaping Use   Vaping Use: Never used  Substance Use Topics   Alcohol use: No   Drug use: Not Currently    Types: Marijuana     Allergies   Thyroid hormones and Methimazole   Review of Systems Review of Systems  Constitutional:  Negative for chills and fever.  HENT:  Positive for congestion and sore throat.   Eyes:  Negative for discharge and redness.  Respiratory:  Positive for cough. Negative for shortness of breath and wheezing.   Gastrointestinal:  Negative for abdominal pain, diarrhea, nausea and vomiting.    Physical Exam Triage Vital Signs ED Triage Vitals  Enc Vitals Group  BP      Pulse      Resp      Temp      Temp src      SpO2      Weight      Height      Head Circumference      Peak Flow      Pain Score      Pain Loc      Pain Edu?      Excl. in GC?    No data found.  Updated Vital Signs BP 134/87 (BP Location: Left Arm)    Pulse 99    Temp 98.1 F (36.7 C) (Oral)    Resp 18    SpO2 98%      Physical Exam Vitals and nursing note reviewed.  Constitutional:      General: She is not in acute distress.    Appearance: Normal appearance. She is not ill-appearing.  HENT:     Head: Normocephalic and atraumatic.     Nose: Congestion present.     Mouth/Throat:     Mouth: Mucous membranes are moist.     Pharynx: Posterior oropharyngeal erythema present. No oropharyngeal exudate.  Eyes:     Conjunctiva/sclera: Conjunctivae normal.  Cardiovascular:     Rate and Rhythm: Normal rate and regular rhythm.     Heart sounds: Normal heart sounds. No murmur heard. Pulmonary:     Effort: Pulmonary effort is normal. No respiratory  distress.     Breath sounds: Normal breath sounds. No wheezing, rhonchi or rales.  Skin:    General: Skin is warm and dry.  Neurological:     Mental Status: She is alert.  Psychiatric:        Mood and Affect: Mood normal.        Thought Content: Thought content normal.     UC Treatments / Results  Labs (all labs ordered are listed, but only abnormal results are displayed) Labs Reviewed  CULTURE, GROUP A STREP [facility])  POCT RAPID STREP A (OFFICE)    EKG   Radiology No results found.  Procedures Procedures (including critical care time)  Medications Ordered in UC Medications - No data to display  Initial Impression / Assessment and Plan / UC Course  I have reviewed the triage vital signs and the nursing notes.  Pertinent labs & imaging results that were available during my care of the patient were reviewed by me and considered in my medical decision making (see chart for details).    Suspect viral etiology of symptoms and recommended symptomatic treatment. Encouraged follow up if no gradual improvement or if symptoms worsen in any way, she develops fever, etc. Strep test negative in office and throat culture ordered.   Final Clinical Impressions(s) / UC Diagnoses   Final diagnoses:  Acute pharyngitis, unspecified etiology   Discharge Instructions   None    ED Prescriptions   None    PDMP not reviewed this encounter.   Tomi Bamberger, PA-C 12/19/21 1447

## 2021-12-22 LAB — CULTURE, GROUP A STREP (THRC)

## 2023-05-06 ENCOUNTER — Telehealth: Payer: Self-pay

## 2023-05-06 ENCOUNTER — Other Ambulatory Visit (INDEPENDENT_AMBULATORY_CARE_PROVIDER_SITE_OTHER): Payer: Medicaid Other

## 2023-05-06 DIAGNOSIS — E039 Hypothyroidism, unspecified: Secondary | ICD-10-CM

## 2023-05-06 LAB — TSH: TSH: 88.5 u[IU]/mL — ABNORMAL HIGH (ref 0.35–5.50)

## 2023-05-06 LAB — T4, FREE: Free T4: 0.24 ng/dL — ABNORMAL LOW (ref 0.60–1.60)

## 2023-05-06 NOTE — Telephone Encounter (Signed)
Jahki Witham Ann Jamirra Curnow, CMA  ?

## 2023-05-11 ENCOUNTER — Ambulatory Visit (INDEPENDENT_AMBULATORY_CARE_PROVIDER_SITE_OTHER): Payer: Medicaid Other | Admitting: "Endocrinology

## 2023-05-11 ENCOUNTER — Encounter: Payer: Self-pay | Admitting: "Endocrinology

## 2023-05-11 ENCOUNTER — Telehealth: Payer: Self-pay

## 2023-05-11 VITALS — BP 120/79 | HR 73 | Ht 63.0 in | Wt 180.4 lb

## 2023-05-11 DIAGNOSIS — E890 Postprocedural hypothyroidism: Secondary | ICD-10-CM

## 2023-05-11 MED ORDER — LEVOTHYROXINE SODIUM 137 MCG PO TABS: 137.0000 ug | ORAL_TABLET | Freq: Every day | ORAL | 1 refills | Status: DC

## 2023-05-11 NOTE — Telephone Encounter (Signed)
Garnell Phenix Ann Alexi Geibel, CMA  ?

## 2023-05-11 NOTE — Progress Notes (Signed)
Outpatient Endocrinology Note Margaret Granville, MD  05/11/23   Margaret Walsh 02/22/1991 [id number]  Referring Provider: Randel Pigg, Dorma Russell, MD Primary Care Provider: Patient, No Pcp Per Subjective  Chief Complaint  Patient presents with   Hypothyroidism    Pituitary Adenoma    Assessment & Plan  Malley was seen today for hypothyroidism.  Diagnoses and all orders for this visit:  Postoperative hypothyroidism -     TSH Rfx on Abnormal to Free T4; Future  History of total thyroidectomy  Other orders -     Discontinue: levothyroxine (SYNTHROID) 137 MCG tablet; Take 1 tablet (137 mcg total) by mouth daily before breakfast. SYNTHROID 137 mcg QD    Shruthi Yeley is currently taking levothyroxine 100 mcg once a week.  Reports skin allergy with levothyroxine.  Used Synthroid to in the past and prefers that. Patient is currently biochemically hypothyroid.  Educated on thyroid axis.  Recommend the following: Take Synthroid 137 mcg every morning.  Advised to take levothyroxine first thing in the morning on empty stomach and wait at least 30 minutes to 1 hour before eating or drinking anything or taking any other medications. Space out levothyroxine by 4 hours from any acid reflux medication/fibrate/iron/calcium/multivitamin. Advised to take birth control pills and nutritional supplements in the evening. Repeat lab before next visit or sooner if symptoms of hyperthyroidism or hypothyroidism develop.  Notify us immediately in case of pregnancy/breastfeeding or significant weight gain or loss. Counseled on compliance and follow up needs.  I have reviewed current medications, nurse's notes, allergies, vital signs, past medical and surgical history, family medical history, and social history for this encounter. Counseled patient on symptoms, examination findings, lab findings, imaging results, treatment decisions and monitoring and prognosis. The patient understood the recommendations and  agrees with the treatment plan. All questions regarding treatment plan were fully answered.   Return in about 3 months (around 08/11/2023) for visit + labs before next visit.   Margaret Hawthorne, MD  05/11/23   I have reviewed current medications, nurse's notes, allergies, vital signs, past medical and surgical history, family medical history, and social history for this encounter. Counseled patient on symptoms, examination findings, lab findings, imaging results, treatment decisions and monitoring and prognosis. The patient understood the recommendations and agrees with the treatment plan. All questions regarding treatment plan were fully answered.   History of Present Illness Margaret Walsh is a 32 y.o. year old female who presents to our clinic with postsurgical hypothyroidism.  S/p total thyroidectomy in 2017 due to goiter with resolution of dysphagia  Since then, pt reports weight is off sometimes, hair fall, skin issues  Pt has changed endocrinologist due to ongoing issues  On levothyroxine 100 mcg once a day-non-compliant. Reports tachycardia, anxiety and itchiness     Physical Exam  BP 120/79   Pulse 73   Ht 5\' 3"  (1.6 m)   Wt 180 lb 6.4 oz (81.8 kg)   SpO2 97%   BMI 31.96 kg/m  Constitutional: well developed, well nourished Head: normocephalic, atraumatic, no exophthalmos Eyes: sclera anicteric, no redness Neck: Thyroidectomy scar well-healed Lungs: normal respiratory effort Neurology: alert and oriented, no fine hand tremor Skin: dry, no appreciable rashes Musculoskeletal: no appreciable defects Psychiatric: normal mood and affect  Allergies Allergies  Allergen Reactions   Thyroid Hormones     Allergic to unknown thyroid med   Methimazole Rash    Current Medications Patient's Medications  New Prescriptions   LEVOTHYROXINE (SYNTHROID) 137 MCG TABLET  Take 1 tablet (137 mcg total) by mouth daily before breakfast. SYNTHROID 137 mcg QD  Previous Medications    FLUTICASONE (FLONASE) 50 MCG/ACT NASAL SPRAY    Place 1 spray into both nostrils daily.   IBUPROFEN (ADVIL) 800 MG TABLET    Take 1 tablet (800 mg total) by mouth 3 (three) times daily.   METFORMIN (GLUCOPHAGE) 500 MG TABLET    TAKE 1 TABLET BY MOUTH ONCE DAILY WITH BREAKFAST   PRENATAL VIT-FE FUMARATE-FA (PRENATAL MULTIVITAMIN) TABS TABLET    Take 1 tablet by mouth daily at 12 noon.  Modified Medications   No medications on file  Discontinued Medications   LEVOTHYROXINE (SYNTHROID) 125 MCG TABLET    Take 1 tablet (125 mcg total) by mouth daily before breakfast.    Past Medical History Past Medical History:  Diagnosis Date   COVID-19 virus detected    Diabetes mellitus without complication (HCC)    Pancreatitis    Thyroid disease     Past Surgical History Past Surgical History:  Procedure Laterality Date   HERNIA REPAIR     THYROIDECTOMY      Family History family history includes Healthy in her mother.  Social History Social History   Socioeconomic History   Marital status: Single    Spouse name: Not on file   Number of children: Not on file   Years of education: Not on file   Highest education level: Not on file  Occupational History   Not on file  Tobacco Use   Smoking status: Former    Types: Cigarettes    Quit date: 07/31/2018    Years since quitting: 4.7   Smokeless tobacco: Never  Vaping Use   Vaping Use: Never used  Substance and Sexual Activity   Alcohol use: No   Drug use: Not Currently    Types: Marijuana   Sexual activity: Not on file  Other Topics Concern   Not on file  Social History Narrative   Not on file   Social Determinants of Health   Financial Resource Strain: Not on file  Food Insecurity: Not on file  Transportation Needs: Not on file  Physical Activity: Not on file  Stress: Not on file  Social Connections: Not on file  Intimate Partner Violence: Not on file    Laboratory Investigations Lab Results  Component Value Date    TSH 88.50 (H) 05/06/2023   TSH 0.058 (L) 10/18/2018   FREET4 0.24 (L) 05/06/2023   FREET4 0.98 10/18/2018     No results found for: "TSI"   No components found for: "TRAB"   No results found for: "CHOL" No results found for: "HDL" No results found for: "LDLCALC" No results found for: "TRIG" No results found for: "CHOLHDL" Lab Results  Component Value Date   CREATININE 0.70 10/20/2018   No results found for: "GFR"    Component Value Date/Time   NA 138 10/20/2018 0357   K 3.5 10/20/2018 0357   CL 104 10/20/2018 0357   CO2 24 10/20/2018 0357   GLUCOSE 113 (H) 10/20/2018 0357   BUN 7 10/20/2018 0357   CREATININE 0.70 10/20/2018 0357   CALCIUM 8.9 10/20/2018 0357   PROT 7.3 05/14/2018 2231   ALBUMIN 3.9 05/14/2018 2231   AST 14 (L) 05/14/2018 2231   ALT 18 05/14/2018 2231   ALKPHOS 54 05/14/2018 2231   BILITOT 0.7 05/14/2018 2231   GFRNONAA >60 10/20/2018 0357   GFRAA >60 10/20/2018 0357      Latest Ref  Rng & Units 10/20/2018    3:57 AM 10/19/2018    4:08 AM 10/18/2018    4:08 AM  BMP  Glucose 70 - 99 mg/dL 161  096  045   BUN 6 - 20 mg/dL 7  6  15    Creatinine 0.44 - 1.00 mg/dL 4.09  8.11  9.14   Sodium 135 - 145 mmol/L 138  136  137   Potassium 3.5 - 5.1 mmol/L 3.5  3.5  4.0   Chloride 98 - 111 mmol/L 104  104  100   CO2 22 - 32 mmol/L 24  20  22    Calcium 8.9 - 10.3 mg/dL 8.9  8.4  78.2        Component Value Date/Time   WBC 7.5 04/20/2020 1549   RBC 4.50 04/20/2020 1549   HGB 11.5 (L) 04/20/2020 1549   HCT 36.1 04/20/2020 1549   PLT 173 04/20/2020 1549   MCV 80.2 04/20/2020 1549   MCH 25.6 (L) 04/20/2020 1549   MCHC 31.9 04/20/2020 1549   RDW 14.5 04/20/2020 1549   LYMPHSABS 2.3 10/21/2018 0520   MONOABS 0.4 10/21/2018 0520   EOSABS 0.1 10/21/2018 0520   BASOSABS 0.0 10/21/2018 0520      Parts of this note may have been dictated using voice recognition software. There may be variances in spelling and vocabulary which are unintentional. Not all errors  are proofread. Please notify the Thereasa Parkin if any discrepancies are noted or if the meaning of any statement is not clear.

## 2023-06-06 ENCOUNTER — Telehealth: Payer: Self-pay | Admitting: "Endocrinology

## 2023-06-06 NOTE — Telephone Encounter (Signed)
Patient is calling to say that she started levothyroxine levothyroxine (SYNTHROID) 137 MCG tablet On 05/12/2023.  Patient states that she has been having her menses since 05/26/2023 and normally she only has it for 5 days.  Patient states that she has been taking the Levothyroxine every morning at 7:00 AM, but she states that she did not take it today because of the bleeding.  Patient wants to know what she needs to do.

## 2023-06-08 NOTE — Telephone Encounter (Signed)
I spoke to [facility] and she is aware of the recommendation and instructions

## 2023-08-10 ENCOUNTER — Other Ambulatory Visit (INDEPENDENT_AMBULATORY_CARE_PROVIDER_SITE_OTHER): Payer: Medicaid Other

## 2023-08-10 DIAGNOSIS — E890 Postprocedural hypothyroidism: Secondary | ICD-10-CM | POA: Diagnosis not present

## 2023-08-12 LAB — TSH RFX ON ABNORMAL TO FREE T4: TSH: 0.955 u[IU]/mL (ref 0.450–4.500)

## 2023-08-17 ENCOUNTER — Ambulatory Visit: Payer: Medicaid Other | Admitting: "Endocrinology

## 2023-08-18 ENCOUNTER — Ambulatory Visit (INDEPENDENT_AMBULATORY_CARE_PROVIDER_SITE_OTHER): Payer: Medicaid Other | Admitting: "Endocrinology

## 2023-08-18 ENCOUNTER — Encounter: Payer: Self-pay | Admitting: "Endocrinology

## 2023-08-18 VITALS — BP 120/63 | HR 58 | Wt 178.8 lb

## 2023-08-18 DIAGNOSIS — E890 Postprocedural hypothyroidism: Secondary | ICD-10-CM

## 2023-08-18 MED ORDER — LEVOTHYROXINE SODIUM 137 MCG PO TABS: 137.0000 ug | ORAL_TABLET | Freq: Every day | ORAL | 1 refills | Status: DC

## 2023-08-18 NOTE — Progress Notes (Signed)
Outpatient Endocrinology Note [place], MD  08/18/23   Margaret Walsh 06-24-1991 [id number]  Referring Provider: No ref. provider found Primary Care Provider: Patient, No Pcp Per Subjective  No chief complaint on file.   Assessment & Plan  Diagnoses and all orders for this visit:  Postoperative hypothyroidism -     TSH; Future  History of total thyroidectomy  Other orders -     levothyroxine (SYNTHROID) 137 MCG tablet; Take 1 tablet (137 mcg total) by mouth daily before breakfast. SYNTHROID 137 mcg QD     Margaret Walsh is currently taking levothyroxine 100 mcg once a week.  Reports skin allergy with levothyroxine.  Used Synthroid to in the past and prefers that. Patient is currently biochemically hypothyroid.  Educated on thyroid axis.  Recommend the following: Take Synthroid 137 mcg every morning.  Advised to take levothyroxine first thing in the morning on empty stomach and wait at least 30 minutes to 1 hour before eating or drinking anything or taking any other medications. Space out levothyroxine by 4 hours from any acid reflux medication/fibrate/iron/calcium/multivitamin. Advised to take birth control pills and nutritional supplements in the evening. Repeat lab before next visit or sooner if symptoms of hyperthyroidism or hypothyroidism develop.  Notify us immediately in case of pregnancy/breastfeeding or significant weight gain or loss. Counseled on compliance and follow up needs.  I have reviewed current medications, nurse's notes, allergies, vital signs, past medical and surgical history, family medical history, and social history for this encounter. Counseled patient on symptoms, examination findings, lab findings, imaging results, treatment decisions and monitoring and prognosis. The patient understood the recommendations and agrees with the treatment plan. All questions regarding treatment plan were fully answered.   Return in about 4 months (around  12/19/2023) for visit, labs before next visit.   [place], MD  08/18/23   I have reviewed current medications, nurse's notes, allergies, vital signs, past medical and surgical history, family medical history, and social history for this encounter. Counseled patient on symptoms, examination findings, lab findings, imaging results, treatment decisions and monitoring and prognosis. The patient understood the recommendations and agrees with the treatment plan. All questions regarding treatment plan were fully answered.   History of Present Illness Margaret Walsh is a 32 y.o. year old female who presents to our clinic with postsurgical hypothyroidism.  S/p total thyroidectomy in 2017 due to goiter with resolution of dysphagia  Since then, pt reports weight is off sometimes, hair fall, skin issues  Pt has changed endocrinologist due to ongoing issues  On levothyroxine 137    Outpatient Endocrinology Note [place], MD  08/18/23   Margaret Walsh 09/17/1991 [id number]  Referring Provider: No ref. provider found Primary Care Provider: Patient, No Pcp Per Subjective  No chief complaint on file.   Assessment & Plan  Diagnoses and all orders for this visit:  Postoperative hypothyroidism -     TSH; Future  History of total thyroidectomy  Other orders -     levothyroxine (SYNTHROID) 137 MCG tablet; Take 1 tablet (137 mcg total) by mouth daily before breakfast. SYNTHROID 137 mcg QD    Margaret Walsh is currently taking levothyroxine 100 mcg once a week.  Reports skin allergy with levothyroxine.  Used Synthroid to in the past and prefers that. Patient is currently biochemically hypothyroid.  Educated on thyroid axis.  Recommend the following: Take Synthroid 137 mcg every morning.  Advised to take levothyroxine first thing in the morning on empty stomach and wait  at least 30 minutes to 1 hour before eating or drinking anything or taking any other medications. Space out  levothyroxine by 4 hours from any acid reflux medication/fibrate/iron/calcium/multivitamin. Advised to take birth control pills and nutritional supplements in the evening. Repeat lab before next visit or sooner if symptoms of hyperthyroidism or hypothyroidism develop.  Notify us immediately in case of pregnancy/breastfeeding or significant weight gain or loss. Counseled on compliance and follow up needs.  I have reviewed current medications, nurse's notes, allergies, vital signs, past medical and surgical history, family medical history, and social history for this encounter. Counseled patient on symptoms, examination findings, lab findings, imaging results, treatment decisions and monitoring and prognosis. The patient understood the recommendations and agrees with the treatment plan. All questions regarding treatment plan were fully answered.   Return in about 4 months (around 12/19/2023) for visit, labs before next visit.   [place], MD  08/18/23   I have reviewed current medications, nurse's notes, allergies, vital signs, past medical and surgical history, family medical history, and social history for this encounter. Counseled patient on symptoms, examination findings, lab findings, imaging results, treatment decisions and monitoring and prognosis. The patient understood the recommendations and agrees with the treatment plan. All questions regarding treatment plan were fully answered.   History of Present Illness Margaret Walsh is a 32 y.o. year old female who presents to our clinic with postsurgical hypothyroidism.  S/p total thyroidectomy in 2017 due to goiter with resolution of dysphagia  On levothyroxine 137 mcg once a day, compliant.   Symptoms suggestive of HYPOTHYROIDISM:  fatigue No weight gain No cold intolerance  No constipation  No  Symptoms suggestive of HYPERTHYROIDISM:  weight loss  No heat intolerance No hyperdefecation  No palpitations  No  Compressive  symptoms:  dysphagia  No dysphonia  No positional dyspnea (especially with simultaneous arms elevation)  No  Smokes  No On biotin  No  Physical Exam  BP 120/63   Pulse (!) 58   Wt 178 lb 12.8 oz (81.1 kg)   SpO2 96%   BMI 31.67 kg/m  Constitutional: well developed, well nourished Head: normocephalic, atraumatic, no exophthalmos Eyes: sclera anicteric, no redness Neck: Thyroidectomy scar well-healed Lungs: normal respiratory effort Neurology: alert and oriented, no fine hand tremor Skin: dry, no appreciable rashes Musculoskeletal: no appreciable defects Psychiatric: normal mood and affect  Allergies Allergies  Allergen Reactions   Thyroid Hormones     Allergic to unknown thyroid med   Methimazole Rash    Current Medications Patient's Medications  New Prescriptions   No medications on file  Previous Medications   FLUTICASONE (FLONASE) 50 MCG/ACT NASAL SPRAY    Place 1 spray into both nostrils daily.   IBUPROFEN (ADVIL) 800 MG TABLET    Take 1 tablet (800 mg total) by mouth 3 (three) times daily.   METFORMIN (GLUCOPHAGE) 500 MG TABLET    TAKE 1 TABLET BY MOUTH ONCE DAILY WITH BREAKFAST   PRENATAL VIT-FE FUMARATE-FA (PRENATAL MULTIVITAMIN) TABS TABLET    Take 1 tablet by mouth daily at 12 noon.  Modified Medications   Modified Medication Previous Medication   LEVOTHYROXINE (SYNTHROID) 137 MCG TABLET levothyroxine (SYNTHROID) 137 MCG tablet      Take 1 tablet (137 mcg total) by mouth daily before breakfast. SYNTHROID 137 mcg QD    Take 1 tablet (137 mcg total) by mouth daily before breakfast. SYNTHROID 137 mcg QD  Discontinued Medications   No medications on file    Past Medical  History Past Medical History:  Diagnosis Date   COVID-19 virus detected    Diabetes mellitus without complication (HCC)    Pancreatitis    Thyroid disease     Past Surgical History Past Surgical History:  Procedure Laterality Date   HERNIA REPAIR     THYROIDECTOMY      Family  History family history includes Healthy in her mother.  Social History Social History   Socioeconomic History   Marital status: Single    Spouse name: Not on file   Number of children: Not on file   Years of education: Not on file   Highest education level: Not on file  Occupational History   Not on file  Tobacco Use   Smoking status: Former    Current packs/day: 0.00    Types: Cigarettes    Quit date: 07/31/2018    Years since quitting: 5.0   Smokeless tobacco: Never  Vaping Use   Vaping status: Never Used  Substance and Sexual Activity   Alcohol use: No   Drug use: Not Currently    Types: Marijuana   Sexual activity: Not on file  Other Topics Concern   Not on file  Social History Narrative   Not on file   Social Determinants of Health   Financial Resource Strain: Not on file  Food Insecurity: Low Risk  (06/05/2023)   Received from [facility]   Hunger Vital Sign    Worried About Running Out of Food in the Last Year: Never true    Ran Out of Food in the Last Year: Never true  Transportation Needs: Not on file (06/05/2023)  Physical Activity: Not on file  Stress: Not on file  Social Connections: Not on file  Intimate Partner Violence: Not At Risk (12/05/2020)   Received from [facility] visits prior to 01/15/2023., [facility] visits prior to 01/15/2023.   Humiliation, Afraid, Rape, and Kick questionnaire    Fear of Current or Ex-Partner: No    Emotionally Abused: No    Physically Abused: No    Sexually Abused: No    Laboratory Investigations Lab Results  Component Value Date   TSH 0.955 08/10/2023   TSH 88.50 (H) 05/06/2023   TSH 0.058 (L) 10/18/2018   FREET4 0.24 (L) 05/06/2023   FREET4 0.98 10/18/2018     No results found for: "TSI"   No components found for: "TRAB"   No results found for: "CHOL" No results found for: "HDL" No results found for: "LDLCALC" No results found for: "TRIG" No results found for:  "CHOLHDL" Lab Results  Component Value Date   CREATININE 0.70 10/20/2018   No results found for: "GFR"    Component Value Date/Time   NA 138 10/20/2018 0357   K 3.5 10/20/2018 0357   CL 104 10/20/2018 0357   CO2 24 10/20/2018 0357   GLUCOSE 113 (H) 10/20/2018 0357   BUN 7 10/20/2018 0357   CREATININE 0.70 10/20/2018 0357   CALCIUM 8.9 10/20/2018 0357   PROT 7.3 05/14/2018 2231   ALBUMIN 3.9 05/14/2018 2231   AST 14 (L) 05/14/2018 2231   ALT 18 05/14/2018 2231   ALKPHOS 54 05/14/2018 2231   BILITOT 0.7 05/14/2018 2231   GFRNONAA >60 10/20/2018 0357   GFRAA >60 10/20/2018 0357      Latest Ref Rng & Units 10/20/2018    3:57 AM 10/19/2018    4:08 AM 10/18/2018    4:08 AM  BMP  Glucose 70 -  99 mg/dL 308  657  846   BUN 6 - 20 mg/dL 7  6  15    Creatinine 0.44 - 1.00 mg/dL 9.62  9.52  8.41   Sodium 135 - 145 mmol/L 138  136  137   Potassium 3.5 - 5.1 mmol/L 3.5  3.5  4.0   Chloride 98 - 111 mmol/L 104  104  100   CO2 22 - 32 mmol/L 24  20  22    Calcium 8.9 - 10.3 mg/dL 8.9  8.4  32.4        Component Value Date/Time   WBC 7.5 04/20/2020 1549   RBC 4.50 04/20/2020 1549   HGB 11.5 (L) 04/20/2020 1549   HCT 36.1 04/20/2020 1549   PLT 173 04/20/2020 1549   MCV 80.2 04/20/2020 1549   MCH 25.6 (L) 04/20/2020 1549   MCHC 31.9 04/20/2020 1549   RDW 14.5 04/20/2020 1549   LYMPHSABS 2.3 10/21/2018 0520   MONOABS 0.4 10/21/2018 0520   EOSABS 0.1 10/21/2018 0520   BASOSABS 0.0 10/21/2018 0520      Parts of this note may have been dictated using voice recognition software. There may be variances in spelling and vocabulary which are unintentional. Not all errors are proofread. Please notify the Thereasa Parkin if any discrepancies are noted or if the meaning of any statement is not clear.    mcg once a day-non-compliant. Reports tachycardia, anxiety and itchiness     Physical Exam  BP 120/63   Pulse (!) 58   Wt 178 lb 12.8 oz (81.1 kg)   SpO2 96%   BMI 31.67 kg/m   Constitutional: well developed, well nourished Head: normocephalic, atraumatic, no exophthalmos Eyes: sclera anicteric, no redness Neck: Thyroidectomy scar well-healed Lungs: normal respiratory effort Neurology: alert and oriented, no fine hand tremor Skin: dry, no appreciable rashes Musculoskeletal: no appreciable defects Psychiatric: normal mood and affect  Allergies Allergies  Allergen Reactions   Thyroid Hormones     Allergic to unknown thyroid med   Methimazole Rash    Current Medications Patient's Medications  New Prescriptions   No medications on file  Previous Medications   FLUTICASONE (FLONASE) 50 MCG/ACT NASAL SPRAY    Place 1 spray into both nostrils daily.   IBUPROFEN (ADVIL) 800 MG TABLET    Take 1 tablet (800 mg total) by mouth 3 (three) times daily.   METFORMIN (GLUCOPHAGE) 500 MG TABLET    TAKE 1 TABLET BY MOUTH ONCE DAILY WITH BREAKFAST   PRENATAL VIT-FE FUMARATE-FA (PRENATAL MULTIVITAMIN) TABS TABLET    Take 1 tablet by mouth daily at 12 noon.  Modified Medications   Modified Medication Previous Medication   LEVOTHYROXINE (SYNTHROID) 137 MCG TABLET levothyroxine (SYNTHROID) 137 MCG tablet      Take 1 tablet (137 mcg total) by mouth daily before breakfast. SYNTHROID 137 mcg QD    Take 1 tablet (137 mcg total) by mouth daily before breakfast. SYNTHROID 137 mcg QD  Discontinued Medications   No medications on file    Past Medical History Past Medical History:  Diagnosis Date   COVID-19 virus detected    Diabetes mellitus without complication (HCC)    Pancreatitis    Thyroid disease     Past Surgical History Past Surgical History:  Procedure Laterality Date   HERNIA REPAIR     THYROIDECTOMY      Family History family history includes Healthy in her mother.  Social History Social History   Socioeconomic History   Marital status: Single  Spouse name: Not on file   Number of children: Not on file   Years of education: Not on file   Highest  education level: Not on file  Occupational History   Not on file  Tobacco Use   Smoking status: Former    Current packs/day: 0.00    Types: Cigarettes    Quit date: 07/31/2018    Years since quitting: 5.0   Smokeless tobacco: Never  Vaping Use   Vaping status: Never Used  Substance and Sexual Activity   Alcohol use: No   Drug use: Not Currently    Types: Marijuana   Sexual activity: Not on file  Other Topics Concern   Not on file  Social History Narrative   Not on file   Social Determinants of Health   Financial Resource Strain: Not on file  Food Insecurity: Low Risk  (06/05/2023)   Received from [facility]   Hunger Vital Sign    Worried About Running Out of Food in the Last Year: Never true    Ran Out of Food in the Last Year: Never true  Transportation Needs: Not on file (06/05/2023)  Physical Activity: Not on file  Stress: Not on file  Social Connections: Not on file  Intimate Partner Violence: Not At Risk (12/05/2020)   Received from [facility] visits prior to 01/15/2023., [facility] visits prior to 01/15/2023.   Humiliation, Afraid, Rape, and Kick questionnaire    Fear of Current or Ex-Partner: No    Emotionally Abused: No    Physically Abused: No    Sexually Abused: No    Laboratory Investigations Lab Results  Component Value Date   TSH 0.955 08/10/2023   TSH 88.50 (H) 05/06/2023   TSH 0.058 (L) 10/18/2018   FREET4 0.24 (L) 05/06/2023   FREET4 0.98 10/18/2018     No results found for: "TSI"   No components found for: "TRAB"   No results found for: "CHOL" No results found for: "HDL" No results found for: "LDLCALC" No results found for: "TRIG" No results found for: "CHOLHDL" Lab Results  Component Value Date   CREATININE 0.70 10/20/2018   No results found for: "GFR"    Component Value Date/Time   NA 138 10/20/2018 0357   K 3.5 10/20/2018 0357   CL 104 10/20/2018 0357   CO2 24 10/20/2018 0357   GLUCOSE  113 (H) 10/20/2018 0357   BUN 7 10/20/2018 0357   CREATININE 0.70 10/20/2018 0357   CALCIUM 8.9 10/20/2018 0357   PROT 7.3 05/14/2018 2231   ALBUMIN 3.9 05/14/2018 2231   AST 14 (L) 05/14/2018 2231   ALT 18 05/14/2018 2231   ALKPHOS 54 05/14/2018 2231   BILITOT 0.7 05/14/2018 2231   GFRNONAA >60 10/20/2018 0357   GFRAA >60 10/20/2018 0357      Latest Ref Rng & Units 10/20/2018    3:57 AM 10/19/2018    4:08 AM 10/18/2018    4:08 AM  BMP  Glucose 70 - 99 mg/dL 409  811  914   BUN 6 - 20 mg/dL 7  6  15    Creatinine 0.44 - 1.00 mg/dL 7.82  9.56  2.13   Sodium 135 - 145 mmol/L 138  136  137   Potassium 3.5 - 5.1 mmol/L 3.5  3.5  4.0   Chloride 98 - 111 mmol/L 104  104  100   CO2 22 - 32 mmol/L 24  20  22    Calcium  8.9 - 10.3 mg/dL 8.9  8.4  10.9        Component Value Date/Time   WBC 7.5 04/20/2020 1549   RBC 4.50 04/20/2020 1549   HGB 11.5 (L) 04/20/2020 1549   HCT 36.1 04/20/2020 1549   PLT 173 04/20/2020 1549   MCV 80.2 04/20/2020 1549   MCH 25.6 (L) 04/20/2020 1549   MCHC 31.9 04/20/2020 1549   RDW 14.5 04/20/2020 1549   LYMPHSABS 2.3 10/21/2018 0520   MONOABS 0.4 10/21/2018 0520   EOSABS 0.1 10/21/2018 0520   BASOSABS 0.0 10/21/2018 0520      Parts of this note may have been dictated using voice recognition software. There may be variances in spelling and vocabulary which are unintentional. Not all errors are proofread. Please notify the Thereasa Parkin if any discrepancies are noted or if the meaning of any statement is not clear.

## 2023-09-07 ENCOUNTER — Encounter (HOSPITAL_COMMUNITY): Payer: Self-pay | Admitting: Obstetrics and Gynecology

## 2023-09-07 ENCOUNTER — Inpatient Hospital Stay (HOSPITAL_COMMUNITY)
Admission: AD | Admit: 2023-09-07 | Discharge: 2023-09-07 | Disposition: A | Discharge status: Home / Self Care | Payer: Medicaid Other | Attending: Obstetrics and Gynecology | Admitting: Obstetrics and Gynecology

## 2023-09-07 ENCOUNTER — Ambulatory Visit
Admission: EM | Admit: 2023-09-07 | Discharge: 2023-09-07 | Disposition: A | Discharge status: Home / Self Care | Payer: Medicaid Other

## 2023-09-07 ENCOUNTER — Inpatient Hospital Stay (HOSPITAL_COMMUNITY): Payer: Medicaid Other

## 2023-09-07 DIAGNOSIS — O3680X0 Pregnancy with inconclusive fetal viability, not applicable or unspecified: Secondary | ICD-10-CM | POA: Insufficient documentation

## 2023-09-07 DIAGNOSIS — R112 Nausea with vomiting, unspecified: Secondary | ICD-10-CM

## 2023-09-07 DIAGNOSIS — O99611 Diseases of the digestive system complicating pregnancy, first trimester: Secondary | ICD-10-CM | POA: Diagnosis not present

## 2023-09-07 DIAGNOSIS — Z3A01 Less than 8 weeks gestation of pregnancy: Secondary | ICD-10-CM | POA: Insufficient documentation

## 2023-09-07 DIAGNOSIS — R102 Pelvic and perineal pain: Secondary | ICD-10-CM | POA: Diagnosis present

## 2023-09-07 DIAGNOSIS — K219 Gastro-esophageal reflux disease without esophagitis: Secondary | ICD-10-CM | POA: Insufficient documentation

## 2023-09-07 DIAGNOSIS — K529 Noninfective gastroenteritis and colitis, unspecified: Secondary | ICD-10-CM

## 2023-09-07 DIAGNOSIS — R197 Diarrhea, unspecified: Secondary | ICD-10-CM

## 2023-09-07 LAB — URINALYSIS, ROUTINE W REFLEX MICROSCOPIC
Bilirubin Urine: NEGATIVE
Glucose, UA: NEGATIVE mg/dL
Hgb urine dipstick: NEGATIVE
Ketones, ur: NEGATIVE mg/dL
Nitrite: NEGATIVE
Protein, ur: NEGATIVE mg/dL
Specific Gravity, Urine: 1.016 (ref 1.005–1.030)
pH: 5 (ref 5.0–8.0)

## 2023-09-07 LAB — COMPREHENSIVE METABOLIC PANEL
ALT: 18 U/L (ref 0–44)
AST: 15 U/L (ref 15–41)
Albumin: 3.7 g/dL (ref 3.5–5.0)
Alkaline Phosphatase: 42 U/L (ref 38–126)
Anion gap: 10 (ref 5–15)
BUN: 13 mg/dL (ref 6–20)
CO2: 20 mmol/L — ABNORMAL LOW (ref 22–32)
Calcium: 9.2 mg/dL (ref 8.9–10.3)
Chloride: 105 mmol/L (ref 98–111)
Creatinine, Ser: 0.71 mg/dL (ref 0.44–1.00)
GFR, Estimated: >60 mL/min (ref >60)
Glucose, Bld: 101 mg/dL — ABNORMAL HIGH (ref 70–99)
Potassium: 4.1 mmol/L (ref 3.5–5.1)
Sodium: 135 mmol/L (ref 135–145)
Total Bilirubin: 0.9 mg/dL (ref 0.3–1.2)
Total Protein: 7.1 g/dL (ref 6.5–8.1)

## 2023-09-07 LAB — DIFFERENTIAL
Abs Immature Granulocytes: 0.04 10*3/uL (ref 0.00–0.07)
Basophils Absolute: 0 10*3/uL (ref 0.0–0.1)
Basophils Relative: 0 %
Eosinophils Absolute: 0 10*3/uL (ref 0.0–0.5)
Eosinophils Relative: 0 %
Immature Granulocytes: 0 %
Lymphocytes Relative: 7 %
Lymphs Abs: 0.6 10*3/uL — ABNORMAL LOW (ref 0.7–4.0)
Monocytes Absolute: 0.4 10*3/uL (ref 0.1–1.0)
Monocytes Relative: 4 %
Neutro Abs: 8.5 10*3/uL — ABNORMAL HIGH (ref 1.7–7.7)
Neutrophils Relative %: 89 %

## 2023-09-07 LAB — CBC
HCT: 39.5 % (ref 36.0–46.0)
HCT: 36.4 % (ref 36.0–46.0)
Hemoglobin: 12.8 g/dL (ref 12.0–15.0)
Hemoglobin: 11.6 g/dL — ABNORMAL LOW (ref 12.0–15.0)
MCH: 25.7 pg — ABNORMAL LOW (ref 26.0–34.0)
MCH: 25.2 pg — ABNORMAL LOW (ref 26.0–34.0)
MCHC: 32.4 g/dL (ref 30.0–36.0)
MCHC: 31.9 g/dL (ref 30.0–36.0)
MCV: 79.2 fL — ABNORMAL LOW (ref 80.0–100.0)
MCV: 79.1 fL — ABNORMAL LOW (ref 80.0–100.0)
Platelets: 181 10*3/uL (ref 150–400)
Platelets: 161 10*3/uL (ref 150–400)
RBC: 4.99 MIL/uL (ref 3.87–5.11)
RBC: 4.6 MIL/uL (ref 3.87–5.11)
RDW: 13.6 % (ref 11.5–15.5)
RDW: 13.8 % (ref 11.5–15.5)
WBC: 10.5 10*3/uL (ref 4.0–10.5)
WBC: 9.6 10*3/uL (ref 4.0–10.5)
nRBC: 0 % (ref 0.0–0.2)
nRBC: 0 % (ref 0.0–0.2)

## 2023-09-07 LAB — WET PREP, GENITAL
Clue Cells Wet Prep HPF POC: NONE SEEN
Sperm: NONE SEEN
Trich, Wet Prep: NONE SEEN
WBC, Wet Prep HPF POC: >=10 — AB (ref <10)
Yeast Wet Prep HPF POC: NONE SEEN

## 2023-09-07 LAB — RESPIRATORY PANEL BY PCR

## 2023-09-07 LAB — HIV ANTIBODY (ROUTINE TESTING W REFLEX): HIV Screen 4th Generation wRfx: NONREACTIVE

## 2023-09-07 LAB — HCG, QUANTITATIVE, PREGNANCY: hCG, Beta Chain, Quant, S: 90 m[IU]/mL — ABNORMAL HIGH (ref <5)

## 2023-09-07 MED ORDER — LACTATED RINGERS IV BOLUS
1000.0000 mL | Freq: Once | INTRAVENOUS | Status: AC
  Administered 2023-09-07: 1000 mL via INTRAVENOUS

## 2023-09-07 MED ORDER — ACETAMINOPHEN-CAFFEINE 500-65 MG PO TABS
2.0000 | ORAL_TABLET | Freq: Once | ORAL | Status: AC
  Administered 2023-09-07: 2 via ORAL
  Filled 2023-09-07: qty 2

## 2023-09-07 MED ORDER — FAMOTIDINE 20 MG PO TABS: 20.0000 mg | ORAL_TABLET | Freq: Two times a day (BID) | ORAL | 0 refills | Status: DC

## 2023-09-07 NOTE — MAU Note (Signed)
Preg test is POSITIVE (the negative result will be corrected in chart)

## 2023-09-07 NOTE — Discharge Instructions (Signed)
YOU NEED TO HAVE A REPEAT ULTRASOUND IN 2 WEEKS. CALL DR. DORN AND HAVE HIS OFFICE SCHEDULE THAT.

## 2023-09-07 NOTE — Discharge Instructions (Signed)
Please go to the maternity assessment unit at [facility] for further evaluation and management.

## 2023-09-07 NOTE — ED Triage Notes (Signed)
Patient states she had diarrhea, feeling cold and cramping in stomach this morning.

## 2023-09-07 NOTE — ED Notes (Signed)
Patient is being discharged from the Urgent Care and sent to the Emergency Department via POV . Per Ervin Knack, NP, patient is in need of higher level of care due to tachycardia. Patient is aware and verbalizes understanding of plan of care.  Vitals:   09/07/23 1443  BP: 117/75  Pulse: (!) 114  Resp: 18  Temp: 98.9 F (37.2 C)  SpO2: 98%

## 2023-09-07 NOTE — MAU Note (Signed)
...  Margaret Walsh is a 32 y.o. at Unknown here in MAU reporting: Middle upper abdominal pain as well as a HA since this morning. Reports two episodes of diarrhea today. Temp 102.6 F in triage. P 120's-130's. Denies cough, SOB, chest pain, and congestion.  LMP: 08/15/2023 Onset of complaint:  Pain score: 6/10 abdomen, 6/10 HA  Lab orders placed from triage: POCT Preg, UA

## 2023-09-07 NOTE — ED Provider Notes (Signed)
[facility]    CSN: [id number] Arrival date & time: 09/07/23  1343      History   Chief Complaint No chief complaint on file.   HPI Margaret Walsh is a 32 y.o. female.   Patient presents with nausea, vomiting, diarrhea, abdominal cramping that started this morning.  Denies blood in stool or emesis.  She reports that she is currently pregnant but is not sure how far along she has.  States that her last menstrual cycle was 08/15/2023.  She denies any known sick contacts, fever, body aches, chills.  She has been drinking minimal water.  Reports that she is urinating.     Past Medical History:  Diagnosis Date   COVID-19 virus detected    Diabetes mellitus without complication (HCC)    Pancreatitis    Thyroid disease     Patient Active Problem List   Diagnosis Date Noted   Myocarditis (HCC) 10/20/2018   Sinus tachycardia 10/18/2018   Hypothyroidism 10/18/2018   Acute kidney injury (HCC) 10/18/2018   Fever     Past Surgical History:  Procedure Laterality Date   HERNIA REPAIR     THYROIDECTOMY      OB History     Gravida  3   Para  1   Term  1   Preterm      AB      Living  1      SAB      IAB      Ectopic      Multiple      Live Births  1            Home Medications    Prior to Admission medications   Medication Sig Start Date End Date Taking? Authorizing Provider  fluticasone (FLONASE) 50 MCG/ACT nasal spray Place 1 spray into both nostrils daily. Patient not taking: Reported on 08/18/2023 03/28/20   Belinda Fisher, PA-C  ibuprofen (ADVIL) 800 MG tablet Take 1 tablet (800 mg total) by mouth 3 (three) times daily. 01/08/21   Wieters, Hallie C, PA-C  levothyroxine (SYNTHROID) 137 MCG tablet Take 1 tablet (137 mcg total) by mouth daily before breakfast. SYNTHROID 137 mcg QD 08/18/23   Motwani, Komal, MD  metFORMIN (GLUCOPHAGE) 500 MG tablet TAKE 1 TABLET BY MOUTH ONCE DAILY WITH BREAKFAST Patient not taking: Reported on 05/11/2023 12/15/19    Mardella Layman, MD  Prenatal Vit-Fe Fumarate-FA (PRENATAL MULTIVITAMIN) TABS tablet Take 1 tablet by mouth daily at 12 noon.    [provider]    Family History Family History  Problem Relation Age of Onset   Healthy Mother     Social History Social History   Tobacco Use   Smoking status: Former    Current packs/day: 0.00    Types: Cigarettes    Quit date: 07/31/2018    Years since quitting: 5.1   Smokeless tobacco: Never  Vaping Use   Vaping status: Never Used  Substance Use Topics   Alcohol use: No   Drug use: Not Currently    Types: Marijuana     Allergies   Thyroid hormones and Methimazole   Review of Systems Review of Systems Per HPI  Physical Exam Triage Vital Signs ED Triage Vitals  Encounter Vitals Group     BP 09/07/23 1443 117/75     Systolic BP Percentile --      Diastolic BP Percentile --      Pulse Rate 09/07/23 1443 (!) 114  Resp 09/07/23 1443 18     Temp 09/07/23 1443 98.9 F (37.2 C)     Temp Source 09/07/23 1443 Oral     SpO2 09/07/23 1443 98 %     Weight 09/07/23 1441 178 lb (80.7 kg)     Height 09/07/23 1441 5\' 3"  (1.6 m)     Head Circumference --      Peak Flow --      Pain Score 09/07/23 1441 7     Pain Loc --      Pain Education --      Exclude from Growth Chart --    No data found.  Updated Vital Signs BP 117/75 (BP Location: Left Arm)   Pulse (!) 114   Temp 98.9 F (37.2 C) (Oral)   Resp 18   Ht 5\' 3"  (1.6 m)   Wt 178 lb (80.7 kg)   LMP 08/15/2023 (Exact Date)   SpO2 98%   BMI 31.53 kg/m   Visual Acuity Right Eye Distance:   Left Eye Distance:   Bilateral Distance:    Right Eye Near:   Left Eye Near:    Bilateral Near:     Physical Exam Constitutional:      General: She is not in acute distress.    Appearance: Normal appearance. She is not toxic-appearing or diaphoretic.  HENT:     Head: Normocephalic and atraumatic.     Mouth/Throat:     Mouth: Mucous membranes are moist.     Pharynx: No  posterior oropharyngeal erythema.  Eyes:     Extraocular Movements: Extraocular movements intact.     Conjunctiva/sclera: Conjunctivae normal.  Cardiovascular:     Rate and Rhythm: Regular rhythm. Tachycardia present.     Pulses: Normal pulses.     Heart sounds: Normal heart sounds.  Pulmonary:     Effort: Pulmonary effort is normal. No respiratory distress.     Breath sounds: Normal breath sounds.  Abdominal:     General: Bowel sounds are normal. There is no distension.     Palpations: Abdomen is soft.     Tenderness: There is no abdominal tenderness.  Neurological:     General: No focal deficit present.     Mental Status: She is alert and oriented to person, place, and time. Mental status is at baseline.  Psychiatric:        Mood and Affect: Mood normal.        Behavior: Behavior normal.        Thought Content: Thought content normal.        Judgment: Judgment normal.      UC Treatments / Results  Labs (all labs ordered are listed, but only abnormal results are displayed) Labs Reviewed - No data to display  EKG   Radiology No results found.  Procedures Procedures (including critical care time)  Medications Ordered in UC Medications - No data to display  Initial Impression / Assessment and Plan / UC Course  I have reviewed the triage vital signs and the nursing notes.  Pertinent labs & imaging results that were available during my care of the patient were reviewed by me and considered in my medical decision making (see chart for details).     Symptoms are most likely viral in nature but given tachycardia noted on examination and concern for dehydration in pregnancy, recommended that she go to the maternity assessment unit for further evaluation and management.  She was agreeable with this plan.  Vital signs and  patient stable at discharge.  Agree with patient self transport to the ER. Final Clinical Impressions(s) / UC Diagnoses   Final diagnoses:  Nausea  vomiting and diarrhea     Discharge Instructions      Please go to the maternity assessment unit at [facility] for further evaluation and management.    ED Prescriptions   None    PDMP not reviewed this encounter.   Gustavus Bryant, [place] 09/07/23 [phone]

## 2023-09-07 NOTE — MAU Provider Note (Signed)
History     CSN: [id number]  Arrival date and time: 09/07/23 1535   Event Date/Time   First Provider Initiated Contact with Patient 09/07/23 1711      Chief Complaint  Patient presents with   Abdominal Pain   Headache   Diarrhea   HPI [name] is a 32 y.o. year old G14P1001 female at [redacted]w[redacted]d weeks gestation who presents to MAU reporting mid to upper abdominal and H/A since this morning; rated 6/10. She reports 2 episodes of diarrhea today. She denies cough, SOB, CP or congestion. Her LMP is reported as being 9/30-10/4. She plans to receive [facility] with Dr. Shawnie Pons; next appt is 10/12/2023. Her sister-in-law, Margaret Walsh, is present and contributing to the history taking.     OB History     Gravida  3   Para  1   Term  1   Preterm      AB      Living  1      SAB      IAB      Ectopic      Multiple      Live Births  1           Past Medical History:  Diagnosis Date   COVID-19 virus detected    Diabetes mellitus without complication (HCC)    Pancreatitis    Thyroid disease     Past Surgical History:  Procedure Laterality Date   HERNIA REPAIR     THYROIDECTOMY      Family History  Problem Relation Age of Onset   Healthy Mother     Social History   Tobacco Use   Smoking status: Former    Current packs/day: 0.00    Types: Cigarettes    Quit date: 07/31/2018    Years since quitting: 5.1   Smokeless tobacco: Never  Vaping Use   Vaping status: Never Used  Substance Use Topics   Alcohol use: No   Drug use: Not Currently    Types: Marijuana    Allergies:  Allergies  Allergen Reactions   Thyroid Hormones     Allergic to unknown thyroid med   Methimazole Rash    No medications prior to admission.    Review of Systems  Constitutional:  Positive for fever.  Eyes: Negative.   Respiratory: Negative.    Cardiovascular: Negative.   Gastrointestinal:  Positive for abdominal pain (mid-upper) and diarrhea (x 2 episodes today).   Endocrine: Negative.   Genitourinary: Negative.   Musculoskeletal: Negative.   Skin: Negative.   Allergic/Immunologic: Negative.   Neurological:  Positive for headaches.  Hematological: Negative.   Psychiatric/Behavioral: Negative.     Physical Exam   Patient Vitals for the past 24 hrs:  BP Temp Temp src Pulse Resp SpO2 Height Weight  09/07/23 1954 138/64 -- -- (!) 108 -- -- -- --  09/07/23 1923 -- (!) 101.3 F (38.5 C) Oral -- -- -- -- --  09/07/23 1652 112/69 (!) 102 F (38.9 C) Oral (!) 120 18 -- -- --  09/07/23 1650 -- -- -- -- -- 99 % -- --  09/07/23 1625 108/71 (!) 102.6 F (39.2 C) Oral (!) 137 20 99 % 5\' 3"  (1.6 m) 80.7 kg    Physical Exam Vitals and nursing note reviewed.  Constitutional:      Appearance: Normal appearance. She is obese.  HENT:     Head: Normocephalic and atraumatic.  Cardiovascular:     Rate and Rhythm: Regular  rhythm. Tachycardia present.     Heart sounds: Normal heart sounds.  Pulmonary:     Effort: Pulmonary effort is normal.     Breath sounds: Normal breath sounds.  Abdominal:     General: Abdomen is flat.     Palpations: Abdomen is soft.  Genitourinary:    Comments: Not indicated Musculoskeletal:        General: Normal range of motion.  Skin:    General: Skin is warm and dry.  Neurological:     Mental Status: She is alert and oriented to person, place, and time.  Psychiatric:        Mood and Affect: Mood normal.        Behavior: Behavior normal.        Thought Content: Thought content normal.        Judgment: Judgment normal.    MAU Course  Procedures  MDM CCUA COVID/RSV/FLU Start IV LR 1 liter bolus CBC w/Diff CMP HCG ABO/Rh OB <14 wks TVUS Wet Prep GC/CT -- Results pending    Results for orders placed or performed during the hospital encounter of 09/07/23 (from the past 24 hour(s))  Urinalysis, Routine w reflex microscopic -Urine, Clean Catch     Status: Abnormal   Collection Time: 09/07/23  3:42 PM  Result  Value Ref Range   Color, Urine YELLOW YELLOW   APPearance HAZY (A) CLEAR   Specific Gravity, Urine 1.016 1.005 - 1.030   pH 5.0 5.0 - 8.0   Glucose, UA NEGATIVE NEGATIVE mg/dL   Hgb urine dipstick NEGATIVE NEGATIVE   Bilirubin Urine NEGATIVE NEGATIVE   Ketones, ur NEGATIVE NEGATIVE mg/dL   Protein, ur NEGATIVE NEGATIVE mg/dL   Nitrite NEGATIVE NEGATIVE   Leukocytes,Ua SMALL (A) NEGATIVE   RBC / HPF 0-5 0 - 5 RBC/hpf   WBC, UA 0-5 0 - 5 WBC/hpf   Bacteria, UA RARE (A) NONE SEEN   Squamous Epithelial / HPF 6-10 0 - 5 /HPF  Pregnancy, urine POC     Status: None   Collection Time: 09/07/23  4:00 PM  Result Value Ref Range   Preg Test, Ur NEGATIVE NEGATIVE  CBC     Status: Abnormal   Collection Time: 09/07/23  4:20 PM  Result Value Ref Range   WBC 10.5 4.0 - 10.5 K/uL   RBC 4.99 3.87 - 5.11 MIL/uL   Hemoglobin 12.8 12.0 - 15.0 g/dL   HCT 63.8 75.6 - 43.3 %   MCV 79.2 (L) 80.0 - 100.0 fL   MCH 25.7 (L) 26.0 - 34.0 pg   MCHC 32.4 30.0 - 36.0 g/dL   RDW 29.5 18.8 - 41.6 %   Platelets 181 150 - 400 K/uL   nRBC 0.0 0.0 - 0.2 %  hCG, quantitative, pregnancy     Status: Abnormal   Collection Time: 09/07/23  4:20 PM  Result Value Ref Range   hCG, Beta Chain, Quant, S 90 (H) <5 mIU/mL  HIV Antibody (routine testing w rflx)     Status: None   Collection Time: 09/07/23  4:20 PM  Result Value Ref Range   HIV Screen 4th Generation wRfx Non Reactive Non Reactive  Respiratory (~20 pathogens) panel by PCR     Status: None   Collection Time: 09/07/23  4:32 PM   Specimen: Nasopharyngeal Swab; Respiratory  Result Value Ref Range   Adenovirus NOT DETECTED NOT DETECTED   Coronavirus 229E NOT DETECTED NOT DETECTED   Coronavirus HKU1 NOT DETECTED NOT DETECTED   Coronavirus  NL63 NOT DETECTED NOT DETECTED   Coronavirus OC43 NOT DETECTED NOT DETECTED   Metapneumovirus NOT DETECTED NOT DETECTED   Rhinovirus / Enterovirus NOT DETECTED NOT DETECTED   Influenza A NOT DETECTED NOT DETECTED    Influenza B NOT DETECTED NOT DETECTED   Parainfluenza Virus 1 NOT DETECTED NOT DETECTED   Parainfluenza Virus 2 NOT DETECTED NOT DETECTED   Parainfluenza Virus 3 NOT DETECTED NOT DETECTED   Parainfluenza Virus 4 NOT DETECTED NOT DETECTED   Respiratory Syncytial Virus NOT DETECTED NOT DETECTED   Bordetella pertussis NOT DETECTED NOT DETECTED   Bordetella Parapertussis NOT DETECTED NOT DETECTED   Chlamydophila pneumoniae NOT DETECTED NOT DETECTED   Mycoplasma pneumoniae NOT DETECTED NOT DETECTED  Wet prep, genital     Status: Abnormal   Collection Time: 09/07/23  4:54 PM   Specimen: Vaginal  Result Value Ref Range   Yeast Wet Prep HPF POC NONE SEEN NONE SEEN   Trich, Wet Prep NONE SEEN NONE SEEN   Clue Cells Wet Prep HPF POC NONE SEEN NONE SEEN   WBC, Wet Prep HPF POC >=10 (A) <10   Sperm NONE SEEN   Comprehensive metabolic panel     Status: Abnormal   Collection Time: 09/07/23  6:45 PM  Result Value Ref Range   Sodium 135 135 - 145 mmol/L   Potassium 4.1 3.5 - 5.1 mmol/L   Chloride 105 98 - 111 mmol/L   CO2 20 (L) 22 - 32 mmol/L   Glucose, Bld 101 (H) 70 - 99 mg/dL   BUN 13 6 - 20 mg/dL   Creatinine, Ser 0.93 0.44 - 1.00 mg/dL   Calcium 9.2 8.9 - 26.7 mg/dL   Total Protein 7.1 6.5 - 8.1 g/dL   Albumin 3.7 3.5 - 5.0 g/dL   AST 15 15 - 41 U/L   ALT 18 0 - 44 U/L   Alkaline Phosphatase 42 38 - 126 U/L   Total Bilirubin 0.9 0.3 - 1.2 mg/dL   GFR, Estimated >12 >45 mL/min   Anion gap 10 5 - 15  Differential     Status: Abnormal   Collection Time: 09/07/23  6:45 PM  Result Value Ref Range   Neutrophils Relative % 89 %   Neutro Abs 8.5 (H) 1.7 - 7.7 K/uL   Lymphocytes Relative 7 %   Lymphs Abs 0.6 (L) 0.7 - 4.0 K/uL   Monocytes Relative 4 %   Monocytes Absolute 0.4 0.1 - 1.0 K/uL   Eosinophils Relative 0 %   Eosinophils Absolute 0.0 0.0 - 0.5 K/uL   Basophils Relative 0 %   Basophils Absolute 0.0 0.0 - 0.1 K/uL   Immature Granulocytes 0 %   Abs Immature Granulocytes 0.04  0.00 - 0.07 K/uL  CBC     Status: Abnormal   Collection Time: 09/07/23  6:45 PM  Result Value Ref Range   WBC 9.6 4.0 - 10.5 K/uL   RBC 4.60 3.87 - 5.11 MIL/uL   Hemoglobin 11.6 (L) 12.0 - 15.0 g/dL   HCT 80.9 98.3 - 38.2 %   MCV 79.1 (L) 80.0 - 100.0 fL   MCH 25.2 (L) 26.0 - 34.0 pg   MCHC 31.9 30.0 - 36.0 g/dL   RDW 50.5 39.7 - 67.3 %   Platelets 161 150 - 400 K/uL   nRBC 0.0 0.0 - 0.2 %    Assessment and Plan  1. Pregnancy of unknown anatomic location - Repeat HCG in 48 hrs - Scheduled at [facility] on Friday  09/09/2023 @ 0900 - Advised she need to call Dr. Tawni Levy office to get repeat U/S scheduled in 2 wks.  2. Gastroesophageal reflux disease without esophagitis - Information provided on GERD - Rx: Pepcid 20 mg po daily   3. Gastroenteritis - Information provided on diarrhea, food choices for diarrhea   4. [redacted] weeks gestation of pregnancy   - Discharge patient - Advised to arrive 15 mins early for 0900 appt with [facility] on 09/09/2023  - Patient verbalized an understanding of the plan of care and agrees.   Raelyn Mora, CNM 09/07/2023, 5:11 PM

## 2023-09-08 LAB — GC/CHLAMYDIA PROBE AMP (~~LOC~~) NOT AT ARMC
Chlamydia: POSITIVE — AB
Comment: NEGATIVE
Comment: NORMAL
Neisseria Gonorrhea: NEGATIVE

## 2023-09-08 LAB — POCT PREGNANCY, URINE: Preg Test, Ur: POSITIVE — AB

## 2023-09-09 ENCOUNTER — Other Ambulatory Visit: Payer: Medicaid Other

## 2023-09-09 ENCOUNTER — Other Ambulatory Visit: Payer: Self-pay

## 2023-09-09 VITALS — BP 118/70 | HR 87 | Ht 63.0 in | Wt 176.1 lb

## 2023-09-09 DIAGNOSIS — O3680X0 Pregnancy with inconclusive fetal viability, not applicable or unspecified: Secondary | ICD-10-CM

## 2023-09-09 LAB — BETA HCG QUANT (REF LAB): hCG Quant: 155 m[IU]/mL

## 2023-09-09 NOTE — Progress Notes (Unsigned)
Pt here today for STAT Beta r/t pregnancy of unknown location.  Pt denies VB or pain.

## 2023-12-15 ENCOUNTER — Ambulatory Visit
Admission: EM | Admit: 2023-12-15 | Discharge: 2023-12-15 | Disposition: A | Discharge status: Home / Self Care | Payer: Medicaid Other | Attending: Family Medicine | Admitting: Family Medicine

## 2023-12-15 DIAGNOSIS — R0981 Nasal congestion: Secondary | ICD-10-CM

## 2023-12-15 DIAGNOSIS — H9201 Otalgia, right ear: Secondary | ICD-10-CM

## 2023-12-15 DIAGNOSIS — R519 Headache, unspecified: Secondary | ICD-10-CM | POA: Diagnosis not present

## 2023-12-15 MED ORDER — AMOXICILLIN 875 MG PO TABS: 875.0000 mg | ORAL_TABLET | Freq: Two times a day (BID) | ORAL | 0 refills | Status: AC

## 2023-12-15 NOTE — ED Provider Notes (Signed)
[facility]   [id number] 12/15/23 Arrival Time: 0902  ASSESSMENT & PLAN:  1. Acute otalgia, right   2. Nasal sinus congestion   3. Right sided facial pain    Ques developing R OM +/- sinus infection.  Meds ordered this encounter  Medications   amoxicillin (AMOXIL) 875 MG tablet    Sig: Take 1 tablet (875 mg total) by mouth 2 (two) times daily for 7 days.    Dispense:  14 tablet    Refill:  0   Discussed typical duration of symptoms. OTC symptom care as needed. Ensure adequate fluid intake and rest.   Follow-up Information     [facility]   Specialty: Urgent Care Why: If worsening or failing to improve as anticipated. Contact information: [address] [place] [address] [phone]                Reviewed expectations re: course of current medical issues. Questions answered. Outlined signs and symptoms indicating need for more acute intervention. Patient verbalized understanding. After Visit Summary given.   SUBJECTIVE: History from: patient.  Margaret Walsh is a 33 y.o. female who presents with complaint of nasal congestion, post-nasal drainage, and sinus pain. Onset gradual,  ques week ago; worse over past q3-4 days . Respiratory symptoms: none. Fever: denied. Overall normal PO intake without n/v. OTC treatment: Tylenol with some help. No specific aggravating or alleviating factors reported.  Social History   Tobacco Use  Smoking Status Former   Current packs/day: 0.00   Types: Cigarettes   Quit date: 07/31/2018   Years since quitting: 5.3  Smokeless Tobacco Never    OBJECTIVE:  Vitals:   12/15/23 0938  BP: 114/71  Pulse: (!) 105  Resp: 17  Temp: 99 F (37.2 C)  TempSrc: Oral  SpO2: 98%    General appearance: alert; no distress HEENT: nasal congestion; clear runny nose; throat irritation secondary to post-nasal drainage; R-sided maxillary tenderness to  palpation; turbinates boggy; R TM with erythema/bulging Neck: supple without LAD; trachea midline Lungs: unlabored respirations, symmetrical air entry; cough: absent; no respiratory distress Skin: warm and dry Psychological: alert and cooperative; normal mood and affect  Allergies  Allergen Reactions   Thyroid Hormones     Allergic to unknown thyroid med   Methimazole Rash    Past Medical History:  Diagnosis Date   COVID-19 virus detected    Diabetes mellitus without complication (HCC)    Pancreatitis    Thyroid disease    Family History  Problem Relation Age of Onset   Healthy Mother    Social History   Socioeconomic History   Marital status: Single    Spouse name: Not on file   Number of children: Not on file   Years of education: Not on file   Highest education level: Not on file  Occupational History   Not on file  Tobacco Use   Smoking status: Former    Current packs/day: 0.00    Types: Cigarettes    Quit date: 07/31/2018    Years since quitting: 5.3   Smokeless tobacco: Never  Vaping Use   Vaping status: Never Used  Substance and Sexual Activity   Alcohol use: No   Drug use: Not Currently    Types: Marijuana   Sexual activity: Not on file  Other Topics Concern   Not on file  Social History Narrative   Not on file   Social Drivers of Health  Financial Resource Strain: Not on file  Food Insecurity: Low Risk  (06/05/2023)   Received from [facility]   Hunger Vital Sign    Worried About Running Out of Food in the Last Year: Never true    Ran Out of Food in the Last Year: Never true  Transportation Needs: Not on file (06/05/2023)  Physical Activity: Not on file  Stress: Not on file  Social Connections: Not on file  Intimate Partner Violence: Not At Risk (12/05/2020)   Received from [facility] visits prior to 01/15/2023., [facility] visits prior to 01/15/2023.   Humiliation, Afraid, Rape, and Kick  questionnaire    Fear of Current or Ex-Partner: No    Emotionally Abused: No    Physically Abused: No    Sexually Abused: No             Mardella Layman, MD 12/15/23 1121

## 2023-12-15 NOTE — Discharge Instructions (Signed)
Continue taking Tylenol as needed for facial pain.

## 2023-12-15 NOTE — ED Triage Notes (Signed)
Pt presents with c/o nasal congestion X 4 days. States she has facial pressure on the rt side. Pt has not been able to sleep.  Home interventions: Tylenol last night

## 2024-05-16 ENCOUNTER — Other Ambulatory Visit: Payer: Self-pay | Admitting: "Endocrinology

## 2024-05-17 NOTE — Telephone Encounter (Signed)
 Requested Prescriptions   Pending Prescriptions Disp Refills   levothyroxine  (SYNTHROID ) 137 MCG tablet [Pharmacy Med Name: Levothyroxine  Sodium 137 MCG Oral Tablet] 90 tablet 0    Sig: TAKE 1 TABLET BY MOUTH ONCE DAILY BEFORE BREAKFAST

## 2024-08-09 ENCOUNTER — Other Ambulatory Visit: Payer: Self-pay | Admitting: "Endocrinology

## 2024-09-24 ENCOUNTER — Other Ambulatory Visit: Payer: Self-pay | Admitting: "Endocrinology

## 2024-09-25 ENCOUNTER — Ambulatory Visit (INDEPENDENT_AMBULATORY_CARE_PROVIDER_SITE_OTHER): Admitting: "Endocrinology

## 2024-09-25 ENCOUNTER — Encounter: Payer: Self-pay | Admitting: "Endocrinology

## 2024-09-25 ENCOUNTER — Other Ambulatory Visit: Payer: Self-pay

## 2024-09-25 ENCOUNTER — Other Ambulatory Visit

## 2024-09-25 VITALS — BP 104/80 | HR 74 | Ht 63.0 in | Wt 183.0 lb

## 2024-09-25 DIAGNOSIS — Z9889 Other specified postprocedural states: Secondary | ICD-10-CM | POA: Diagnosis not present

## 2024-09-25 DIAGNOSIS — Z9089 Acquired absence of other organs: Secondary | ICD-10-CM

## 2024-09-25 DIAGNOSIS — E890 Postprocedural hypothyroidism: Secondary | ICD-10-CM | POA: Diagnosis not present

## 2024-09-25 MED ORDER — LEVOTHYROXINE SODIUM 137 MCG PO TABS: 137.0000 ug | ORAL_TABLET | Freq: Every day | ORAL | 0 refills | Status: DC

## 2024-09-25 NOTE — Progress Notes (Signed)
 Outpatient Endocrinology Note Obadiah Birmingham, MD  09/25/24   Margaret Walsh Mar 14, 1991 [id number]  Referring Provider: No ref. provider found Primary Care Provider: Patient, No Pcp Per Subjective  No chief complaint on file.   Assessment & Plan  Diagnoses and all orders for this visit:  Postoperative hypothyroidism -     TSH + free T4 -     TSH + free T4  History of total thyroidectomy      Margaret Walsh is currently taking levothyroxine  137 mcg once a week.   Previously reported skin allergy with levothyroxine  and used Synthroid . Now says she is fine with levothyroxine . Patient is currently biochemically hypothyroid.  Educated on thyroid  axis.  Recommend the following: Take levothyroxine  137 mcg every morning. Lab today. Advised to take levothyroxine  first thing in the morning on empty stomach and wait at least 30 minutes to 1 hour before eating or drinking anything or taking any other medications. Space out levothyroxine  by 4 hours from any acid reflux medication/fibrate/iron/calcium/multivitamin. Advised to take birth control pills and nutritional supplements in the evening. Repeat lab before next visit or sooner if symptoms of hyperthyroidism or hypothyroidism develop.  Notify us  immediately in case of pregnancy/breastfeeding or significant weight gain or loss. Counseled on compliance and follow up needs.  I have reviewed current medications, nurse's notes, allergies, vital signs, past medical and surgical history, family medical history, and social history for this encounter. Counseled patient on symptoms, examination findings, lab findings, imaging results, treatment decisions and monitoring and prognosis. The patient understood the recommendations and agrees with the treatment plan. All questions regarding treatment plan were fully answered.   Return in about 6 months (around 03/25/2025) for visit + labs before next visit, labs today.   Obadiah Birmingham, MD   09/25/24   I have reviewed current medications, nurse's notes, allergies, vital signs, past medical and surgical history, family medical history, and social history for this encounter. Counseled patient on symptoms, examination findings, lab findings, imaging results, treatment decisions and monitoring and prognosis. The patient understood the recommendations and agrees with the treatment plan. All questions regarding treatment plan were fully answered.   History of Present Illness Margaret Walsh is a 33 y.o. year old female who presents to our clinic with postsurgical hypothyroidism.  S/p total thyroidectomy in 2017 due to goiter with resolution of dysphagia  Since then, pt reports weight is off sometimes, hair fall, skin issues  Pt has changed endocrinologist due to ongoing issues  On levothyroxine  137    Outpatient Endocrinology Note Obadiah Birmingham, MD  09/25/24   Margaret Walsh January 05, 1991 [id number]  Referring Provider: No ref. provider found Primary Care Provider: Patient, No Pcp Per Subjective  No chief complaint on file.   Assessment & Plan  Diagnoses and all orders for this visit:  Postoperative hypothyroidism -     TSH + free T4 -     TSH + free T4  History of total thyroidectomy   Margaret Walsh is currently taking levothyroxine  137 mcg once a week.  Reports skin allergy with levothyroxine . Patient is currently biochemically hypothyroid.  Educated on thyroid  axis.  Recommend the following: Take levothyroxine  137 mcg every morning.  Advised to take levothyroxine  first thing in the morning on empty stomach and wait at least 30 minutes to 1 hour before eating or drinking anything or taking any other medications. Space out levothyroxine  by 4 hours from any acid reflux medication/fibrate/iron/calcium/multivitamin. Advised to take birth control pills and nutritional supplements in  the evening. Repeat lab before next visit or sooner if symptoms of hyperthyroidism or  hypothyroidism develop.  Notify us  immediately in case of pregnancy/breastfeeding or significant weight gain or loss. Counseled on compliance and follow up needs.  I have reviewed current medications, nurse's notes, allergies, vital signs, past medical and surgical history, family medical history, and social history for this encounter. Counseled patient on symptoms, examination findings, lab findings, imaging results, treatment decisions and monitoring and prognosis. The patient understood the recommendations and agrees with the treatment plan. All questions regarding treatment plan were fully answered.   Return in about 6 months (around 03/25/2025) for visit + labs before next visit, labs today.   Obadiah Birmingham, MD  09/25/24   I have reviewed current medications, nurse's notes, allergies, vital signs, past medical and surgical history, family medical history, and social history for this encounter. Counseled patient on symptoms, examination findings, lab findings, imaging results, treatment decisions and monitoring and prognosis. The patient understood the recommendations and agrees with the treatment plan. All questions regarding treatment plan were fully answered.   History of Present Illness Margaret Walsh is a 33 y.o. year old female who presents to our clinic with postsurgical hypothyroidism.  S/p total thyroidectomy in 2017 due to goiter with resolution of dysphagia  On levothyroxine  137 mcg once a day, compliant.   Symptoms suggestive of HYPOTHYROIDISM:  fatigue No weight gain No cold intolerance  No constipation  No  Symptoms suggestive of HYPERTHYROIDISM:  weight loss  No heat intolerance No hyperdefecation  No palpitations  No  Compressive symptoms:  dysphagia  No dysphonia  No positional dyspnea (especially with simultaneous arms elevation)  No  Smokes  No On biotin  No  Physical Exam  BP 104/80   Pulse 74   Ht 5' 3 (1.6 m)   Wt 183 lb (83 kg)   SpO2 96%    BMI 32.42 kg/m  Constitutional: well developed, well nourished Head: normocephalic, atraumatic, no exophthalmos Eyes: sclera anicteric, no redness Neck: Thyroidectomy scar well-healed Lungs: normal respiratory effort Neurology: alert and oriented, no fine hand tremor Skin: dry, no appreciable rashes Musculoskeletal: no appreciable defects Psychiatric: normal mood and affect  Allergies Allergies  Allergen Reactions   Thyroid  Hormones     Allergic to unknown thyroid  med   Methimazole Rash    Current Medications Patient's Medications  New Prescriptions   No medications on file  Previous Medications   LEVOTHYROXINE  (SYNTHROID ) 137 MCG TABLET    Take 1 tablet (137 mcg total) by mouth daily before breakfast.   METFORMIN  (GLUCOPHAGE ) 500 MG TABLET    TAKE 1 TABLET BY MOUTH ONCE DAILY WITH BREAKFAST   PRENATAL VIT-FE FUMARATE-FA (PRENATAL MULTIVITAMIN) TABS TABLET    Take 1 tablet by mouth daily at 12 noon.  Modified Medications   No medications on file  Discontinued Medications   No medications on file    Past Medical History Past Medical History:  Diagnosis Date   COVID-19 virus detected    Diabetes mellitus without complication (HCC)    Pancreatitis    Thyroid  disease     Past Surgical History Past Surgical History:  Procedure Laterality Date   HERNIA REPAIR     THYROIDECTOMY      Family History family history includes Healthy in her mother.  Social History Social History   Socioeconomic History   Marital status: Single    Spouse name: Not on file   Number of children: Not on file   Years of  education: Not on file   Highest education level: Not on file  Occupational History   Not on file  Tobacco Use   Smoking status: Former    Current packs/day: 0.00    Types: Cigarettes    Quit date: 07/31/2018    Years since quitting: 6.1   Smokeless tobacco: Never  Vaping Use   Vaping status: Never Used  Substance and Sexual Activity   Alcohol use: No   Drug  use: Not Currently    Types: Marijuana   Sexual activity: Not on file  Other Topics Concern   Not on file  Social History Narrative   Not on file   Social Drivers of Health   Financial Resource Strain: Not on file  Food Insecurity: Low Risk  (05/07/2024)   Received from [facility]   Hunger Vital Sign    Within the past 12 months, you worried that your food would run out before you got money to buy more: Never true    Within the past 12 months, the food you bought just didn't last and you didn't have money to get more. : Never true  Transportation Needs: No Transportation Needs (05/07/2024)   Received from [facility]    In the past 12 months, has lack of reliable transportation kept you from medical appointments, meetings, work or from getting things needed for daily living? : No  Physical Activity: Not on file  Stress: Not on file  Social Connections: Not on file  Intimate Partner Violence: Not At Risk (12/05/2020)   Received from [facility] visits prior to 01/15/2023.   Humiliation, Afraid, Rape, and Kick questionnaire    Within the last year, have you been afraid of your partner or ex-partner?: No    Within the last year, have you been humiliated or emotionally abused in other ways by your partner or ex-partner?: No    Within the last year, have you been kicked, hit, slapped, or otherwise physically hurt by your partner or ex-partner?: No    Within the last year, have you been raped or forced to have any kind of sexual activity by your partner or ex-partner?: No    Laboratory Investigations Lab Results  Component Value Date   TSH 0.955 08/10/2023   TSH 88.50 (H) 05/06/2023   TSH 0.058 (L) 10/18/2018   FREET4 0.24 (L) 05/06/2023   FREET4 0.98 10/18/2018     No results found for: TSI   No components found for: TRAB   No results found for: CHOL No results found for: HDL No results found for: LDLCALC No results found for:  TRIG No results found for: [facility] Lab Results  Component Value Date   CREATININE 0.71 09/07/2023   No results found for: GFR    Component Value Date/Time   NA 135 09/07/2023 1845   K 4.1 09/07/2023 1845   CL 105 09/07/2023 1845   CO2 20 (L) 09/07/2023 1845   GLUCOSE 101 (H) 09/07/2023 1845   BUN 13 09/07/2023 1845   CREATININE 0.71 09/07/2023 1845   CALCIUM 9.2 09/07/2023 1845   PROT 7.1 09/07/2023 1845   ALBUMIN 3.7 09/07/2023 1845   AST 15 09/07/2023 1845   ALT 18 09/07/2023 1845   ALKPHOS 42 09/07/2023 1845   BILITOT 0.9 09/07/2023 1845   GFRNONAA >60 09/07/2023 1845   GFRAA >60 10/20/2018 0357      Latest Ref Rng & Units 09/07/2023    6:45 PM 10/20/2018  3:57 AM 10/19/2018    4:08 AM  BMP  Glucose 70 - 99 mg/dL 898  886  883   BUN 6 - 20 mg/dL 13  7  6    Creatinine 0.44 - 1.00 mg/dL 9.28  9.29  9.20   Sodium 135 - 145 mmol/L 135  138  136   Potassium 3.5 - 5.1 mmol/L 4.1  3.5  3.5   Chloride 98 - 111 mmol/L 105  104  104   CO2 22 - 32 mmol/L 20  24  20    Calcium 8.9 - 10.3 mg/dL 9.2  8.9  8.4        Component Value Date/Time   WBC 9.6 09/07/2023 1845   RBC 4.60 09/07/2023 1845   HGB 11.6 (L) 09/07/2023 1845   HCT 36.4 09/07/2023 1845   PLT 161 09/07/2023 1845   MCV 79.1 (L) 09/07/2023 1845   MCH 25.2 (L) 09/07/2023 1845   MCHC 31.9 09/07/2023 1845   RDW 13.8 09/07/2023 1845   LYMPHSABS 0.6 (L) 09/07/2023 1845   MONOABS 0.4 09/07/2023 1845   EOSABS 0.0 09/07/2023 1845   BASOSABS 0.0 09/07/2023 1845      Parts of this note may have been dictated using voice recognition software. There may be variances in spelling and vocabulary which are unintentional. Not all errors are proofread. Please notify the dino if any discrepancies are noted or if the meaning of any statement is not clear.    mcg once a day-non-compliant. Reports tachycardia, anxiety and itchiness     Physical Exam  BP 104/80   Pulse 74   Ht 5' 3 (1.6 m)   Wt 183 lb (83 kg)    SpO2 96%   BMI 32.42 kg/m  Constitutional: well developed, well nourished Head: normocephalic, atraumatic, no exophthalmos Eyes: sclera anicteric, no redness Neck: Thyroidectomy scar well-healed Lungs: normal respiratory effort Neurology: alert and oriented, no fine hand tremor Skin: dry, no appreciable rashes Musculoskeletal: no appreciable defects Psychiatric: normal mood and affect  Allergies Allergies  Allergen Reactions   Thyroid  Hormones     Allergic to unknown thyroid  med   Methimazole Rash    Current Medications Patient's Medications  New Prescriptions   No medications on file  Previous Medications   LEVOTHYROXINE  (SYNTHROID ) 137 MCG TABLET    Take 1 tablet (137 mcg total) by mouth daily before breakfast.   METFORMIN  (GLUCOPHAGE ) 500 MG TABLET    TAKE 1 TABLET BY MOUTH ONCE DAILY WITH BREAKFAST   PRENATAL VIT-FE FUMARATE-FA (PRENATAL MULTIVITAMIN) TABS TABLET    Take 1 tablet by mouth daily at 12 noon.  Modified Medications   No medications on file  Discontinued Medications   No medications on file    Past Medical History Past Medical History:  Diagnosis Date   COVID-19 virus detected    Diabetes mellitus without complication (HCC)    Pancreatitis    Thyroid  disease     Past Surgical History Past Surgical History:  Procedure Laterality Date   HERNIA REPAIR     THYROIDECTOMY      Family History family history includes Healthy in her mother.  Social History Social History   Socioeconomic History   Marital status: Single    Spouse name: Not on file   Number of children: Not on file   Years of education: Not on file   Highest education level: Not on file  Occupational History   Not on file  Tobacco Use   Smoking status: Former  Current packs/day: 0.00    Types: Cigarettes    Quit date: 07/31/2018    Years since quitting: 6.1   Smokeless tobacco: Never  Vaping Use   Vaping status: Never Used  Substance and Sexual Activity   Alcohol use:  No   Drug use: Not Currently    Types: Marijuana   Sexual activity: Not on file  Other Topics Concern   Not on file  Social History Narrative   Not on file   Social Drivers of Health   Financial Resource Strain: Not on file  Food Insecurity: Low Risk  (05/07/2024)   Received from [facility]   Hunger Vital Sign    Within the past 12 months, you worried that your food would run out before you got money to buy more: Never true    Within the past 12 months, the food you bought just didn't last and you didn't have money to get more. : Never true  Transportation Needs: No Transportation Needs (05/07/2024)   Received from [facility]    In the past 12 months, has lack of reliable transportation kept you from medical appointments, meetings, work or from getting things needed for daily living? : No  Physical Activity: Not on file  Stress: Not on file  Social Connections: Not on file  Intimate Partner Violence: Not At Risk (12/05/2020)   Received from [facility] visits prior to 01/15/2023.   Humiliation, Afraid, Rape, and Kick questionnaire    Within the last year, have you been afraid of your partner or ex-partner?: No    Within the last year, have you been humiliated or emotionally abused in other ways by your partner or ex-partner?: No    Within the last year, have you been kicked, hit, slapped, or otherwise physically hurt by your partner or ex-partner?: No    Within the last year, have you been raped or forced to have any kind of sexual activity by your partner or ex-partner?: No    Laboratory Investigations Lab Results  Component Value Date   TSH 0.955 08/10/2023   TSH 88.50 (H) 05/06/2023   TSH 0.058 (L) 10/18/2018   FREET4 0.24 (L) 05/06/2023   FREET4 0.98 10/18/2018     No results found for: TSI   No components found for: TRAB   No results found for: CHOL No results found for: HDL No results found for: LDLCALC No results  found for: TRIG No results found for: [facility] Lab Results  Component Value Date   CREATININE 0.71 09/07/2023   No results found for: GFR    Component Value Date/Time   NA 135 09/07/2023 1845   K 4.1 09/07/2023 1845   CL 105 09/07/2023 1845   CO2 20 (L) 09/07/2023 1845   GLUCOSE 101 (H) 09/07/2023 1845   BUN 13 09/07/2023 1845   CREATININE 0.71 09/07/2023 1845   CALCIUM 9.2 09/07/2023 1845   PROT 7.1 09/07/2023 1845   ALBUMIN 3.7 09/07/2023 1845   AST 15 09/07/2023 1845   ALT 18 09/07/2023 1845   ALKPHOS 42 09/07/2023 1845   BILITOT 0.9 09/07/2023 1845   GFRNONAA >60 09/07/2023 1845   GFRAA >60 10/20/2018 0357      Latest Ref Rng & Units 09/07/2023    6:45 PM 10/20/2018    3:57 AM 10/19/2018    4:08 AM  BMP  Glucose 70 - 99 mg/dL 898  886  883   BUN 6 - 20 mg/dL 13  7  6   Creatinine 0.44 - 1.00 mg/dL 9.28  9.29  9.20   Sodium 135 - 145 mmol/L 135  138  136   Potassium 3.5 - 5.1 mmol/L 4.1  3.5  3.5   Chloride 98 - 111 mmol/L 105  104  104   CO2 22 - 32 mmol/L 20  24  20    Calcium 8.9 - 10.3 mg/dL 9.2  8.9  8.4        Component Value Date/Time   WBC 9.6 09/07/2023 1845   RBC 4.60 09/07/2023 1845   HGB 11.6 (L) 09/07/2023 1845   HCT 36.4 09/07/2023 1845   PLT 161 09/07/2023 1845   MCV 79.1 (L) 09/07/2023 1845   MCH 25.2 (L) 09/07/2023 1845   MCHC 31.9 09/07/2023 1845   RDW 13.8 09/07/2023 1845   LYMPHSABS 0.6 (L) 09/07/2023 1845   MONOABS 0.4 09/07/2023 1845   EOSABS 0.0 09/07/2023 1845   BASOSABS 0.0 09/07/2023 1845      Parts of this note may have been dictated using voice recognition software. There may be variances in spelling and vocabulary which are unintentional. Not all errors are proofread. Please notify the dino if any discrepancies are noted or if the meaning of any statement is not clear.

## 2024-09-26 LAB — T4, FREE: Free T4: 1.3 ng/dL (ref 0.8–1.8)

## 2024-09-26 LAB — TSH+FREE T4: TSH W/REFLEX TO FT4: 0.01 m[IU]/L — ABNORMAL LOW

## 2024-10-05 ENCOUNTER — Other Ambulatory Visit: Payer: Self-pay | Admitting: "Endocrinology

## 2024-10-05 ENCOUNTER — Ambulatory Visit: Payer: Self-pay | Admitting: "Endocrinology

## 2024-10-05 DIAGNOSIS — E890 Postprocedural hypothyroidism: Secondary | ICD-10-CM

## 2024-10-05 MED ORDER — LEVOTHYROXINE SODIUM 125 MCG PO TABS: 125.0000 ug | ORAL_TABLET | Freq: Every day | ORAL | 3 refills | Status: AC

## 2025-01-07 ENCOUNTER — Other Ambulatory Visit

## 2025-01-11 ENCOUNTER — Telehealth: Admitting: "Endocrinology

## 2025-03-18 ENCOUNTER — Other Ambulatory Visit

## 2025-03-25 ENCOUNTER — Ambulatory Visit: Admitting: "Endocrinology
